# Patient Record
Sex: Female | Born: 1995 | Hispanic: Yes | Smoking: Never smoker
Health system: Southern US, Community
[De-identification: ages and names within clinical notes are randomized; demographics above are authoritative.]

## PROBLEM LIST (undated history)

## (undated) DIAGNOSIS — N390 Urinary tract infection, site not specified: Secondary | ICD-10-CM

## (undated) DIAGNOSIS — I499 Cardiac arrhythmia, unspecified: Secondary | ICD-10-CM

## (undated) DIAGNOSIS — R519 Headache, unspecified: Secondary | ICD-10-CM

## (undated) DIAGNOSIS — F419 Anxiety disorder, unspecified: Secondary | ICD-10-CM

## (undated) HISTORY — PX: NO PAST SURGERIES: SHX2092

---

## 2015-03-30 DIAGNOSIS — D649 Anemia, unspecified: Secondary | ICD-10-CM

## 2015-03-30 HISTORY — DX: Anemia, unspecified: D64.9

## 2015-03-30 NOTE — L&D Delivery Note (Signed)
Delivery Note At 10:31 PM a viable female was delivered via Vaginal, Spontaneous Delivery after Pitocin Augmentation (Presentation:vertex ; ROA ).  APGAR:8 ,9 ; weight pending .   Placenta status:spontaneous ,3VI .  Cord.  Cord pH: NA  Anesthesia:  Epidural Episiotomy: None Lacerations: None Suture Repair: NA Est. Blood Loss (mL): 350  Mom to postpartum.  Baby to Couplet care / Skin to Skin  Bottlefeeding.  Prentice DockerMartin A Defrancesco 11/20/2015, 10:47 PM

## 2015-05-14 ENCOUNTER — Other Ambulatory Visit: Payer: Self-pay | Admitting: Advanced Practice Midwife

## 2015-05-14 DIAGNOSIS — Z3401 Encounter for supervision of normal first pregnancy, first trimester: Secondary | ICD-10-CM

## 2015-05-14 LAB — OB RESULTS CONSOLE HGB/HCT, BLOOD: HEMOGLOBIN: 13.4 g/dL

## 2015-05-15 ENCOUNTER — Ambulatory Visit
Admission: RE | Admit: 2015-05-15 | Discharge: 2015-05-15 | Disposition: A | Discharge status: Home / Self Care | Payer: Medicaid Other | Source: Ambulatory Visit | Attending: Advanced Practice Midwife | Admitting: Advanced Practice Midwife

## 2015-05-15 DIAGNOSIS — Z3401 Encounter for supervision of normal first pregnancy, first trimester: Secondary | ICD-10-CM | POA: Insufficient documentation

## 2015-05-15 DIAGNOSIS — Z3A09 9 weeks gestation of pregnancy: Secondary | ICD-10-CM | POA: Diagnosis not present

## 2015-05-15 LAB — OB RESULTS CONSOLE ABO/RH: RH Type: POSITIVE

## 2015-05-15 LAB — OB RESULTS CONSOLE PLATELET COUNT: PLATELETS: 257 10*3/uL

## 2015-05-15 LAB — OB RESULTS CONSOLE ANTIBODY SCREEN: Antibody Screen: NEGATIVE

## 2015-05-15 LAB — OB RESULTS CONSOLE HGB/HCT, BLOOD: HEMATOCRIT: 42 %

## 2015-05-15 LAB — OB RESULTS CONSOLE HEPATITIS B SURFACE ANTIGEN: Hepatitis B Surface Ag: NEGATIVE

## 2015-05-15 LAB — OB RESULTS CONSOLE HIV ANTIBODY (ROUTINE TESTING): HIV: NONREACTIVE

## 2015-07-16 ENCOUNTER — Other Ambulatory Visit: Payer: Self-pay | Admitting: Family Medicine

## 2015-07-16 DIAGNOSIS — Z3689 Encounter for other specified antenatal screening: Secondary | ICD-10-CM

## 2015-07-16 DIAGNOSIS — Z113 Encounter for screening for infections with a predominantly sexual mode of transmission: Secondary | ICD-10-CM

## 2015-07-25 ENCOUNTER — Ambulatory Visit
Admission: RE | Admit: 2015-07-25 | Discharge: 2015-07-25 | Disposition: A | Discharge status: Home / Self Care | Payer: Medicaid Other | Source: Ambulatory Visit | Attending: Family Medicine | Admitting: Family Medicine

## 2015-07-25 DIAGNOSIS — Z3A19 19 weeks gestation of pregnancy: Secondary | ICD-10-CM | POA: Insufficient documentation

## 2015-07-25 DIAGNOSIS — Z36 Encounter for antenatal screening of mother: Secondary | ICD-10-CM | POA: Insufficient documentation

## 2015-07-25 DIAGNOSIS — O321XX0 Maternal care for breech presentation, not applicable or unspecified: Secondary | ICD-10-CM | POA: Insufficient documentation

## 2015-07-25 DIAGNOSIS — Z3689 Encounter for other specified antenatal screening: Secondary | ICD-10-CM

## 2015-08-29 ENCOUNTER — Encounter: Payer: Self-pay | Admitting: Obstetrics and Gynecology

## 2015-08-29 ENCOUNTER — Ambulatory Visit (INDEPENDENT_AMBULATORY_CARE_PROVIDER_SITE_OTHER): Payer: Medicaid Other | Admitting: Obstetrics and Gynecology

## 2015-08-29 VITALS — BP 104/72 | HR 93 | Ht 65.0 in | Wt 131.6 lb

## 2015-08-29 DIAGNOSIS — Z202 Contact with and (suspected) exposure to infections with a predominantly sexual mode of transmission: Secondary | ICD-10-CM

## 2015-08-29 DIAGNOSIS — Z331 Pregnant state, incidental: Secondary | ICD-10-CM

## 2015-08-29 LAB — POCT URINALYSIS DIPSTICK
Bilirubin, UA: NEGATIVE
Blood, UA: NEGATIVE
Glucose, UA: NEGATIVE
KETONES UA: NEGATIVE
LEUKOCYTES UA: NEGATIVE
Nitrite, UA: NEGATIVE
PH UA: 6.5
PROTEIN UA: NEGATIVE
SPEC GRAV UA: 1.015
UROBILINOGEN UA: 0.2

## 2015-08-29 NOTE — Progress Notes (Signed)
NOB transfer from [facility] she is doing well

## 2015-08-29 NOTE — Progress Notes (Signed)
Transfer OB- happy about pregnancy, Works PT at [employer], and in school PT at [employer]. Encouraged enrolling in CBC and IFC; plans to bottle feed.

## 2015-09-04 ENCOUNTER — Other Ambulatory Visit: Payer: Self-pay

## 2015-09-04 DIAGNOSIS — Z113 Encounter for screening for infections with a predominantly sexual mode of transmission: Secondary | ICD-10-CM

## 2015-09-09 LAB — GC/CHLAMYDIA PROBE AMP
Chlamydia trachomatis, NAA: NEGATIVE
NEISSERIA GONORRHOEAE BY PCR: NEGATIVE

## 2015-09-19 ENCOUNTER — Encounter: Payer: Self-pay | Admitting: Obstetrics and Gynecology

## 2015-09-19 ENCOUNTER — Ambulatory Visit (INDEPENDENT_AMBULATORY_CARE_PROVIDER_SITE_OTHER): Payer: Medicaid Other | Admitting: Obstetrics and Gynecology

## 2015-09-19 VITALS — BP 91/65 | HR 85 | Wt 135.7 lb

## 2015-09-19 DIAGNOSIS — Z23 Encounter for immunization: Secondary | ICD-10-CM

## 2015-09-19 DIAGNOSIS — Z131 Encounter for screening for diabetes mellitus: Secondary | ICD-10-CM

## 2015-09-19 DIAGNOSIS — Z3493 Encounter for supervision of normal pregnancy, unspecified, third trimester: Secondary | ICD-10-CM

## 2015-09-19 LAB — POCT URINALYSIS DIPSTICK
BILIRUBIN UA: NEGATIVE
Blood, UA: NEGATIVE
GLUCOSE UA: NEGATIVE
KETONES UA: 5
Nitrite, UA: NEGATIVE
SPEC GRAV UA: 1.015
Urobilinogen, UA: 0.2
pH, UA: 6.5

## 2015-09-19 MED ORDER — TETANUS-DIPHTH-ACELL PERTUSSIS 5-2.5-18.5 LF-MCG/0.5 IM SUSP
0.5000 mL | Freq: Once | INTRAMUSCULAR | Status: AC
  Administered 2015-09-19: 0.5 mL via INTRAMUSCULAR

## 2015-09-19 NOTE — Progress Notes (Signed)
ROB-doing well, no concerns. 

## 2015-09-19 NOTE — Progress Notes (Signed)
ROB- glucola done, blood consent signed,tdap given Pt is doing well 

## 2015-09-19 NOTE — Patient Instructions (Signed)

## 2015-09-20 LAB — HEMOGLOBIN AND HEMATOCRIT, BLOOD
HEMOGLOBIN: 11.8 g/dL (ref 11.1–15.9)
Hematocrit: 35.2 % (ref 34.0–46.6)

## 2015-09-20 LAB — URINE CULTURE: ORGANISM ID, BACTERIA: NO GROWTH

## 2015-09-20 LAB — PLEASE NOTE

## 2015-09-20 LAB — GLUCOSE, 1 HOUR GESTATIONAL: Gestational Diabetes Screen: 67 mg/dL (ref 65–139)

## 2015-10-02 ENCOUNTER — Encounter: Payer: Self-pay | Admitting: Obstetrics and Gynecology

## 2015-10-02 ENCOUNTER — Ambulatory Visit (INDEPENDENT_AMBULATORY_CARE_PROVIDER_SITE_OTHER): Payer: Medicaid Other | Admitting: Obstetrics and Gynecology

## 2015-10-02 ENCOUNTER — Other Ambulatory Visit: Payer: Self-pay | Admitting: Obstetrics and Gynecology

## 2015-10-02 VITALS — BP 96/60 | HR 83 | Wt 136.3 lb

## 2015-10-02 DIAGNOSIS — Z3493 Encounter for supervision of normal pregnancy, unspecified, third trimester: Secondary | ICD-10-CM

## 2015-10-02 LAB — POCT URINALYSIS DIPSTICK
BILIRUBIN UA: NEGATIVE
Blood, UA: NEGATIVE
Glucose, UA: NEGATIVE
KETONES UA: NEGATIVE
NITRITE UA: NEGATIVE
PH UA: 7.5
Spec Grav, UA: 1.01
Urobilinogen, UA: 0.2

## 2015-10-02 MED ORDER — PRENATE PIXIE 10-0.6-0.4-200 MG PO CAPS: 1.0000 | ORAL_CAPSULE | Freq: Every day | ORAL | Status: DC

## 2015-10-02 NOTE — Progress Notes (Signed)
ROB- doing well, 

## 2015-10-02 NOTE — Progress Notes (Signed)
ROB-pt denies any complaints 

## 2015-10-04 LAB — URINE CULTURE: Organism ID, Bacteria: NO GROWTH

## 2015-10-22 ENCOUNTER — Ambulatory Visit (INDEPENDENT_AMBULATORY_CARE_PROVIDER_SITE_OTHER): Payer: Medicaid Other | Admitting: Obstetrics and Gynecology

## 2015-10-22 VITALS — BP 113/66 | HR 99 | Wt 144.0 lb

## 2015-10-22 DIAGNOSIS — Z3493 Encounter for supervision of normal pregnancy, unspecified, third trimester: Secondary | ICD-10-CM

## 2015-10-22 LAB — POCT URINALYSIS DIPSTICK
Blood, UA: NEGATIVE
Glucose, UA: 100
KETONES UA: NEGATIVE
LEUKOCYTES UA: NEGATIVE
Nitrite, UA: NEGATIVE
PH UA: 6.5
SPEC GRAV UA: 1.02
UROBILINOGEN UA: 0.2

## 2015-10-22 NOTE — Progress Notes (Signed)
ROB-doing well, considering IUD PP- h/o heavy menses- will most likely use Mirena

## 2015-10-22 NOTE — Progress Notes (Signed)
ROB- pt is doing well denies any complaints 

## 2015-10-22 NOTE — Patient Instructions (Signed)
Levonorgestrel intrauterine device (IUD) What is this medicine? LEVONORGESTREL IUD (LEE voe nor jes trel) is a contraceptive (birth control) device. The device is placed inside the uterus by a healthcare professional. It is used to prevent pregnancy and can also be used to treat heavy bleeding that occurs during your period. Depending on the device, it can be used for 3 to 5 years. This medicine may be used for other purposes; ask your health care provider or pharmacist if you have questions. What should I tell my health care provider before I take this medicine? They need to know if you have any of these conditions: -abnormal Pap smear -cancer of the breast, uterus, or cervix -diabetes -endometritis -genital or pelvic infection now or in the past -have more than one sexual partner or your partner has more than one partner -heart disease -history of an ectopic or tubal pregnancy -immune system problems -IUD in place -liver disease or tumor -problems with blood clots or take blood-thinners -use intravenous drugs -uterus of unusual shape -vaginal bleeding that has not been explained -an unusual or allergic reaction to levonorgestrel, other hormones, silicone, or polyethylene, medicines, foods, dyes, or preservatives -pregnant or trying to get pregnant -breast-feeding How should I use this medicine? This device is placed inside the uterus by a health care professional. Talk to your pediatrician regarding the use of this medicine in children. Special care may be needed. Overdosage: If you think you have taken too much of this medicine contact a poison control center or emergency room at once. NOTE: This medicine is only for you. Do not share this medicine with others. What if I miss a dose? This does not apply. What may interact with this medicine? Do not take this medicine with any of the following medications: -amprenavir -bosentan -fosamprenavir This medicine may also interact with  the following medications: -aprepitant -barbiturate medicines for inducing sleep or treating seizures -bexarotene -griseofulvin -medicines to treat seizures like carbamazepine, ethotoin, felbamate, oxcarbazepine, phenytoin, topiramate -modafinil -pioglitazone -rifabutin -rifampin -rifapentine -some medicines to treat HIV infection like atazanavir, indinavir, lopinavir, nelfinavir, tipranavir, ritonavir -St. John's wort -warfarin This list may not describe all possible interactions. Give your health care provider a list of all the medicines, herbs, non-prescription drugs, or dietary supplements you use. Also tell them if you smoke, drink alcohol, or use illegal drugs. Some items may interact with your medicine. What should I watch for while using this medicine? Visit your doctor or health care professional for regular check ups. See your doctor if you or your partner has sexual contact with others, becomes HIV positive, or gets a sexual transmitted disease. This product does not protect you against HIV infection (AIDS) or other sexually transmitted diseases. You can check the placement of the IUD yourself by reaching up to the top of your vagina with clean fingers to feel the threads. Do not pull on the threads. It is a good habit to check placement after each menstrual period. Call your doctor right away if you feel more of the IUD than just the threads or if you cannot feel the threads at all. The IUD may come out by itself. You may become pregnant if the device comes out. If you notice that the IUD has come out use a backup birth control method like condoms and call your health care provider. Using tampons will not change the position of the IUD and are okay to use during your period. What side effects may I notice from receiving this medicine?   Side effects that you should report to your doctor or health care professional as soon as possible: -allergic reactions like skin rash, itching or  hives, swelling of the face, lips, or tongue -fever, flu-like symptoms -genital sores -high blood pressure -no menstrual period for 6 weeks during use -pain, swelling, warmth in the leg -pelvic pain or tenderness -severe or sudden headache -signs of pregnancy -stomach cramping -sudden shortness of breath -trouble with balance, talking, or walking -unusual vaginal bleeding, discharge -yellowing of the eyes or skin Side effects that usually do not require medical attention (report to your doctor or health care professional if they continue or are bothersome): -acne -breast pain -change in sex drive or performance -changes in weight -cramping, dizziness, or faintness while the device is being inserted -headache -irregular menstrual bleeding within first 3 to 6 months of use -nausea This list may not describe all possible side effects. Call your doctor for medical advice about side effects. You may report side effects to FDA at 1-800-FDA-1088. Where should I keep my medicine? This does not apply. NOTE: This sheet is a summary. It may not cover all possible information. If you have questions about this medicine, talk to your doctor, pharmacist, or health care provider.    2016, Elsevier/Gold Standard. (2011-04-15 13:54:04)  

## 2015-11-12 ENCOUNTER — Encounter: Payer: Medicaid Other | Admitting: Obstetrics and Gynecology

## 2015-11-12 ENCOUNTER — Ambulatory Visit (INDEPENDENT_AMBULATORY_CARE_PROVIDER_SITE_OTHER): Payer: Medicaid Other | Admitting: Obstetrics and Gynecology

## 2015-11-12 VITALS — BP 113/83 | HR 120 | Wt 146.5 lb

## 2015-11-12 DIAGNOSIS — Z113 Encounter for screening for infections with a predominantly sexual mode of transmission: Secondary | ICD-10-CM

## 2015-11-12 DIAGNOSIS — Z36 Encounter for antenatal screening of mother: Secondary | ICD-10-CM

## 2015-11-12 DIAGNOSIS — Z3493 Encounter for supervision of normal pregnancy, unspecified, third trimester: Secondary | ICD-10-CM

## 2015-11-12 DIAGNOSIS — Z3685 Encounter for antenatal screening for Streptococcus B: Secondary | ICD-10-CM

## 2015-11-12 NOTE — Progress Notes (Signed)
ROB- cultures obtained and labor precautions discussed.

## 2015-11-12 NOTE — Progress Notes (Signed)
ROB- cultures obtained, pt is very emotional states she has good support, pt is having some pelvic pressure

## 2015-11-14 LAB — GC/CHLAMYDIA PROBE AMP
Chlamydia trachomatis, NAA: NEGATIVE
Neisseria gonorrhoeae by PCR: NEGATIVE

## 2015-11-14 LAB — STREP GP B NAA: STREP GROUP B AG: NEGATIVE

## 2015-11-18 ENCOUNTER — Ambulatory Visit (INDEPENDENT_AMBULATORY_CARE_PROVIDER_SITE_OTHER): Payer: Medicaid Other | Admitting: Obstetrics and Gynecology

## 2015-11-18 VITALS — BP 115/70 | HR 104 | Wt 149.2 lb

## 2015-11-18 DIAGNOSIS — Z3493 Encounter for supervision of normal pregnancy, unspecified, third trimester: Secondary | ICD-10-CM

## 2015-11-18 LAB — POCT URINALYSIS DIPSTICK
Blood, UA: NEGATIVE
GLUCOSE UA: NEGATIVE
KETONES UA: 5
NITRITE UA: NEGATIVE
SPEC GRAV UA: 1.025
UROBILINOGEN UA: 0.2
pH, UA: 6.5

## 2015-11-18 NOTE — Progress Notes (Signed)
ROB-reports increased frequency of contractions, discussed negative cultures. Labor precautions re-iterated.

## 2015-11-18 NOTE — Progress Notes (Signed)
ROB- pt is having lots of pelvic pressure, contractions

## 2015-11-20 ENCOUNTER — Inpatient Hospital Stay
Admission: EM | Admit: 2015-11-20 | Discharge: 2015-11-22 | DRG: 775 | Disposition: A | Discharge status: Home / Self Care | Payer: Medicaid Other | Attending: Obstetrics and Gynecology | Admitting: Obstetrics and Gynecology

## 2015-11-20 ENCOUNTER — Inpatient Hospital Stay: Payer: Medicaid Other | Admitting: Anesthesiology

## 2015-11-20 DIAGNOSIS — O4290 Premature rupture of membranes, unspecified as to length of time between rupture and onset of labor, unspecified weeks of gestation: Secondary | ICD-10-CM | POA: Diagnosis present

## 2015-11-20 DIAGNOSIS — Z3403 Encounter for supervision of normal first pregnancy, third trimester: Secondary | ICD-10-CM | POA: Diagnosis not present

## 2015-11-20 DIAGNOSIS — Z3A36 36 weeks gestation of pregnancy: Secondary | ICD-10-CM

## 2015-11-20 DIAGNOSIS — Z79899 Other long term (current) drug therapy: Secondary | ICD-10-CM

## 2015-11-20 LAB — CHLAMYDIA/NGC RT PCR (ARMC ONLY)
Chlamydia Tr: NOT DETECTED
N GONORRHOEAE: NOT DETECTED

## 2015-11-20 LAB — TYPE AND SCREEN
ABO/RH(D): O POS
ANTIBODY SCREEN: NEGATIVE

## 2015-11-20 LAB — CBC
HCT: 37.1 % (ref 35.0–47.0)
HEMOGLOBIN: 12.8 g/dL (ref 12.0–16.0)
MCH: 28.5 pg (ref 26.0–34.0)
MCHC: 34.3 g/dL (ref 32.0–36.0)
MCV: 82.8 fL (ref 80.0–100.0)
PLATELETS: 164 10*3/uL (ref 150–440)
RBC: 4.48 MIL/uL (ref 3.80–5.20)
RDW: 14.4 % (ref 11.5–14.5)
WBC: 10.4 10*3/uL (ref 3.6–11.0)

## 2015-11-20 MED ORDER — SODIUM CHLORIDE FLUSH 0.9 % IV SOLN
INTRAVENOUS | Status: AC
  Filled 2015-11-20: qty 10

## 2015-11-20 MED ORDER — LIDOCAINE HCL (PF) 1 % IJ SOLN
30.0000 mL | INTRAMUSCULAR | Status: DC | PRN
  Filled 2015-11-20: qty 30

## 2015-11-20 MED ORDER — FENTANYL CITRATE (PF) 100 MCG/2ML IJ SOLN: 50.0000 ug | INTRAMUSCULAR | Status: DC | PRN

## 2015-11-20 MED ORDER — KETOROLAC TROMETHAMINE 30 MG/ML IJ SOLN: 30.0000 mg | Freq: Four times a day (QID) | INTRAMUSCULAR | Status: AC | PRN

## 2015-11-20 MED ORDER — OXYTOCIN 40 UNITS IN LACTATED RINGERS INFUSION - SIMPLE MED
2.5000 [IU]/h | INTRAVENOUS | Status: DC
  Administered 2015-11-20: 39.96 [IU]/h via INTRAVENOUS
  Filled 2015-11-20: qty 1000

## 2015-11-20 MED ORDER — FENTANYL 2.5 MCG/ML W/ROPIVACAINE 0.2% IN NS 100 ML EPIDURAL INFUSION (ARMC-ANES)
EPIDURAL | Status: DC | PRN
  Administered 2015-11-20: 10 mL/h via EPIDURAL

## 2015-11-20 MED ORDER — NALBUPHINE HCL 10 MG/ML IJ SOLN: 5.0000 mg | Freq: Once | INTRAMUSCULAR | Status: DC | PRN

## 2015-11-20 MED ORDER — MEPERIDINE HCL 25 MG/ML IJ SOLN: 6.2500 mg | INTRAMUSCULAR | Status: DC | PRN

## 2015-11-20 MED ORDER — BUPIVACAINE HCL (PF) 0.25 % IJ SOLN
INTRAMUSCULAR | Status: DC | PRN
  Administered 2015-11-20: 5 mL via EPIDURAL

## 2015-11-20 MED ORDER — NALOXONE HCL 2 MG/2ML IJ SOSY
1.0000 ug/kg/h | PREFILLED_SYRINGE | INTRAMUSCULAR | Status: DC | PRN
  Filled 2015-11-20: qty 2

## 2015-11-20 MED ORDER — OXYCODONE-ACETAMINOPHEN 5-325 MG PO TABS: 2.0000 | ORAL_TABLET | ORAL | Status: DC | PRN

## 2015-11-20 MED ORDER — DIPHENHYDRAMINE HCL 50 MG/ML IJ SOLN: 12.5000 mg | INTRAMUSCULAR | Status: DC | PRN

## 2015-11-20 MED ORDER — SODIUM CHLORIDE 0.9% FLUSH: 3.0000 mL | INTRAVENOUS | Status: DC | PRN

## 2015-11-20 MED ORDER — MISOPROSTOL 200 MCG PO TABS
ORAL_TABLET | ORAL | Status: AC
  Filled 2015-11-20: qty 4

## 2015-11-20 MED ORDER — NALBUPHINE HCL 10 MG/ML IJ SOLN: 5.0000 mg | INTRAMUSCULAR | Status: DC | PRN

## 2015-11-20 MED ORDER — LACTATED RINGERS IV SOLN
INTRAVENOUS | Status: DC
  Administered 2015-11-20 – 2015-11-21 (×4): via INTRAVENOUS

## 2015-11-20 MED ORDER — LACTATED RINGERS IV SOLN: 500.0000 mL | INTRAVENOUS | Status: DC | PRN

## 2015-11-20 MED ORDER — FENTANYL 2.5 MCG/ML W/ROPIVACAINE 0.2% IN NS 100 ML EPIDURAL INFUSION (ARMC-ANES)
EPIDURAL | Status: AC
  Filled 2015-11-20: qty 100

## 2015-11-20 MED ORDER — ONDANSETRON HCL 4 MG/2ML IJ SOLN: 4.0000 mg | Freq: Four times a day (QID) | INTRAMUSCULAR | Status: DC | PRN

## 2015-11-20 MED ORDER — ONDANSETRON HCL 4 MG/2ML IJ SOLN: 4.0000 mg | Freq: Three times a day (TID) | INTRAMUSCULAR | Status: DC | PRN

## 2015-11-20 MED ORDER — OXYTOCIN 10 UNIT/ML IJ SOLN
INTRAMUSCULAR | Status: AC
  Filled 2015-11-20: qty 2

## 2015-11-20 MED ORDER — SCOPOLAMINE 1 MG/3DAYS TD PT72
1.0000 | MEDICATED_PATCH | Freq: Once | TRANSDERMAL | Status: DC
  Administered 2015-11-21: 1.5 mg via TRANSDERMAL
  Filled 2015-11-20: qty 1

## 2015-11-20 MED ORDER — SOD CITRATE-CITRIC ACID 500-334 MG/5ML PO SOLN: 30.0000 mL | ORAL | Status: DC | PRN

## 2015-11-20 MED ORDER — OXYTOCIN 40 UNITS IN LACTATED RINGERS INFUSION - SIMPLE MED
1.0000 m[IU]/min | INTRAVENOUS | Status: DC
  Administered 2015-11-20: 1 m[IU]/min via INTRAVENOUS

## 2015-11-20 MED ORDER — ACETAMINOPHEN 325 MG PO TABS: 650.0000 mg | ORAL_TABLET | ORAL | Status: DC | PRN

## 2015-11-20 MED ORDER — OXYTOCIN BOLUS FROM INFUSION: 500.0000 mL | Freq: Once | INTRAVENOUS | Status: DC

## 2015-11-20 MED ORDER — OXYCODONE-ACETAMINOPHEN 5-325 MG PO TABS
1.0000 | ORAL_TABLET | ORAL | Status: DC | PRN
  Administered 2015-11-21 (×2): 1 via ORAL
  Filled 2015-11-20 (×2): qty 1

## 2015-11-20 MED ORDER — DIPHENHYDRAMINE HCL 25 MG PO CAPS: 25.0000 mg | ORAL_CAPSULE | ORAL | Status: DC | PRN

## 2015-11-20 MED ORDER — TERBUTALINE SULFATE 1 MG/ML IJ SOLN: 0.2500 mg | Freq: Once | INTRAMUSCULAR | Status: DC | PRN

## 2015-11-20 MED ORDER — NALOXONE HCL 0.4 MG/ML IJ SOLN: 0.4000 mg | INTRAMUSCULAR | Status: DC | PRN

## 2015-11-20 NOTE — Anesthesia Procedure Notes (Signed)
Epidural Patient location during procedure: OB  Staffing Anesthesiologist: Berdine AddisonHOMAS, Nancy Orozco Performed: anesthesiologist   Preanesthetic Checklist Completed: patient identified, site marked, surgical consent, pre-op evaluation, timeout performed, IV checked, risks and benefits discussed and monitors and equipment checked  Epidural Patient position: sitting Prep: Betadine Patient monitoring: heart rate, continuous pulse ox and blood pressure Approach: midline Location: L4-L5 Injection technique: LOR saline  Needle:  Needle type: Tuohy  Needle gauge: 18 G Needle length: 9 cm and 9 Catheter type: closed end flexible Catheter size: 20 Guage Test dose: negative and 1.5% lidocaine with Epi 1:200 K  Assessment Sensory level: T10 Events: blood not aspirated, injection not painful, no injection resistance, negative IV test and no paresthesia  Additional Notes   Patient tolerated the insertion well without complications. 1924 catheter in. 1925 test. 1929 bolus 5ml 0.25% marcaine. Infusion 1932.Reason for block:procedure for pain

## 2015-11-20 NOTE — Progress Notes (Signed)
Labor Check: Pt comfortable. FHR-Cat 1 Contractions - every 4 minutes AF-Clear (SROM) @ 1000 today Cx: 4-5/80/0/vertex  Plan: 1. Pitocin Augmentation 2. Epidural  Herold HarmsMartin A Kyron Schlitt, MD

## 2015-11-20 NOTE — H&P (Signed)
Obstetric History and Physical  Nancy Orozco is a 20 y.o. G1P0 with IUP at 10559w6d presenting for ROM and Labor. Patient states she has been having  Irregular;, none vaginal bleeding, intact, ruptured, clear fluid membranes, with active fetal movement.    Prenatal Course Source of Care: [facility] Pregnancy complications or risks: Patient Active Problem List   Diagnosis Date Noted  . Chlamydia contact, treated 08/29/2015   She plans to breastfeed She desires condoms for postpartum contraception.   Prenatal labs and studies: ABO, Rh: O/Positive/-- (02/16 0000) Antibody: Negative (02/16 0000) Rubella:   RPR:    HBsAg: Negative (02/16 0000)  HIV: Non-reactive (02/16 0000)  RUE:AVWUJWJXGBS:Negative (08/16 1626) 1 hr Glucola 67 Genetic screening not done Anatomy US normal  Prenatal Transfer Tool   No past surgical history on file.  OB History  Gravida Para Term Preterm AB Living  1            SAB TAB Ectopic Multiple Live Births               # Outcome Date GA Lbr Len/2nd Weight Sex Delivery Anes PTL Lv  1 Current               Social History   Social History  . Marital status: Single    Spouse name: N/A  . Number of children: N/A  . Years of education: N/A   Social History Main Topics  . Smoking status: Never Smoker  . Smokeless tobacco: Never Used  . Alcohol use No  . Drug use: No  . Sexual activity: Yes   Other Topics Concern  . Not on file   Social History Narrative  . No narrative on file    No family history on file.  Prescriptions Prior to Admission  Medication Sig Dispense Refill Last Dose  . Prenat-FeAsp-Meth-FA-DHA w/o A (PRENATE PIXIE) 10-0.6-0.4-200 MG CAPS Take 1 capsule by mouth daily. 30 capsule 6 Taking  . Prenatal Vit-Fe Fumarate-FA (PRENATAL MULTIVITAMIN) TABS tablet Take 1 tablet by mouth daily at 12 noon.   Not Taking    No Known Allergies  Review of Systems: Negative except for what is mentioned in HPI.  Physical Exam: BP 120/77 (BP  Location: Left Arm)   Pulse 92   Temp 98.5 F (36.9 C) (Oral)   Resp 16   LMP 03/07/2015  CONSTITUTIONAL: Well-developed, well-nourished female in no acute distress.  HENT:  Normocephalic, atraumatic, External right and left ear normal. Oropharynx is clear and moist EYES: Conjunctivae and EOM are normal. Pupils are equal, round, and reactive to light. No scleral icterus.  NECK: Normal range of motion, supple, no masses SKIN: Skin is warm and dry. No rash noted. Not diaphoretic. No erythema. No pallor. NEUROLGIC: Alert and oriented to person, place, and time. Normal reflexes, muscle tone coordination. No cranial nerve deficit noted. PSYCHIATRIC: Normal mood and affect. Normal behavior. Normal judgment and thought content. CARDIOVASCULAR: Normal heart rate noted, regular rhythm RESPIRATORY: Effort and breath sounds normal, no problems with respiration noted ABDOMEN: Soft, nontender, nondistended, gravid. MUSCULOSKELETAL: Normal range of motion. No edema and no tenderness. 2+ distal pulses.  Cervical Exam: Dilatation 5cm   Effacement 70%   Station -3   Presentation: cephalic FHT:  Baseline rate 145 bpm   Variability moderate  Accelerations present   Decelerations none Contractions: Every 4 mins   Pertinent Labs/Studies:   No results found for this or any previous visit (from the past 24 hour(s)).  Assessment : Nancy Sjogrenindy Ostrum  is a 20 y.o. G1P0 at [redacted]w[redacted]d being admitted for labor. SROM-clear; GBS NEGATIVE O+ Blood  Plan: Labor: Expectant management.  Induction/Augmentation as needed, per protocol FWB: Reassuring fetal heart tracing.  GBS negative Delivery plan: Hopeful for vaginal delivery  Martin A. Beatris Si, MD, FACOG [facility]

## 2015-11-20 NOTE — Progress Notes (Signed)
Labor Note:  FHR: Cat 1 CX: C/C/+1/ROA  Plan: Start pushing  Herold HarmsMartin A Praise Dolecki, MD

## 2015-11-20 NOTE — Anesthesia Preprocedure Evaluation (Signed)
Anesthesia Evaluation  Patient identified by MRN, date of birth, ID band Patient awake    Reviewed: Allergy & Precautions, NPO status , Patient's Chart, lab work & pertinent test results, reviewed documented beta blocker date and time   Airway Mallampati: II  TM Distance: >3 FB     Dental  (+) Chipped   Pulmonary           Cardiovascular      Neuro/Psych    GI/Hepatic   Endo/Other    Renal/GU      Musculoskeletal   Abdominal   Peds  Hematology   Anesthesia Other Findings   Reproductive/Obstetrics                             Anesthesia Physical Anesthesia Plan  ASA: II  Anesthesia Plan: Epidural   Post-op Pain Management:    Induction:   Airway Management Planned:   Additional Equipment:   Intra-op Plan:   Post-operative Plan:   Informed Consent: I have reviewed the patients History and Physical, chart, labs and discussed the procedure including the risks, benefits and alternatives for the proposed anesthesia with the patient or authorized representative who has indicated his/her understanding and acceptance.     Plan Discussed with: CRNA  Anesthesia Plan Comments:         Anesthesia Quick Evaluation  

## 2015-11-20 NOTE — Progress Notes (Signed)
Labor Check:  Cx: unchanged FHR: Category 1  Assessment: 1. Slow progress  Plan:  1.  Continue Pitocin Augmentation.  Herold HarmsMartin A Avriel Kandel, MD

## 2015-11-21 DIAGNOSIS — Z3403 Encounter for supervision of normal first pregnancy, third trimester: Secondary | ICD-10-CM

## 2015-11-21 LAB — CBC
HCT: 34.9 % — ABNORMAL LOW (ref 35.0–47.0)
Hemoglobin: 11.9 g/dL — ABNORMAL LOW (ref 12.0–16.0)
MCH: 28.3 pg (ref 26.0–34.0)
MCHC: 34.2 g/dL (ref 32.0–36.0)
MCV: 82.6 fL (ref 80.0–100.0)
PLATELETS: 141 10*3/uL — AB (ref 150–440)
RBC: 4.22 MIL/uL (ref 3.80–5.20)
RDW: 14.5 % (ref 11.5–14.5)
WBC: 13 10*3/uL — AB (ref 3.6–11.0)

## 2015-11-21 LAB — RPR: RPR Ser Ql: NONREACTIVE

## 2015-11-21 MED ORDER — WITCH HAZEL-GLYCERIN EX PADS: 1.0000 "application " | MEDICATED_PAD | CUTANEOUS | Status: DC | PRN

## 2015-11-21 MED ORDER — IBUPROFEN 800 MG PO TABS
800.0000 mg | ORAL_TABLET | Freq: Three times a day (TID) | ORAL | Status: DC
  Administered 2015-11-21 – 2015-11-22 (×6): 800 mg via ORAL
  Filled 2015-11-21 (×6): qty 1

## 2015-11-21 MED ORDER — BENZOCAINE-MENTHOL 20-0.5 % EX AERO: 1.0000 "application " | INHALATION_SPRAY | CUTANEOUS | Status: DC | PRN

## 2015-11-21 MED ORDER — PRENATAL MULTIVITAMIN CH
1.0000 | ORAL_TABLET | Freq: Every day | ORAL | Status: DC
  Administered 2015-11-21 – 2015-11-22 (×2): 1 via ORAL
  Filled 2015-11-21 (×2): qty 1

## 2015-11-21 MED ORDER — ONDANSETRON HCL 4 MG PO TABS: 4.0000 mg | ORAL_TABLET | ORAL | Status: DC | PRN

## 2015-11-21 MED ORDER — MEASLES, MUMPS & RUBELLA VAC ~~LOC~~ INJ: 0.5000 mL | INJECTION | Freq: Once | SUBCUTANEOUS | Status: DC

## 2015-11-21 MED ORDER — ONDANSETRON HCL 4 MG/2ML IJ SOLN: 4.0000 mg | INTRAMUSCULAR | Status: DC | PRN

## 2015-11-21 MED ORDER — IBUPROFEN 800 MG PO TABS: 800.0000 mg | ORAL_TABLET | Freq: Three times a day (TID) | ORAL | 0 refills | Status: DC

## 2015-11-21 MED ORDER — ACETAMINOPHEN 325 MG PO TABS: 650.0000 mg | ORAL_TABLET | ORAL | Status: DC | PRN

## 2015-11-21 MED ORDER — DOCUSATE SODIUM 100 MG PO CAPS
100.0000 mg | ORAL_CAPSULE | Freq: Two times a day (BID) | ORAL | Status: DC
  Administered 2015-11-21 – 2015-11-22 (×2): 100 mg via ORAL
  Filled 2015-11-21 (×2): qty 1

## 2015-11-21 MED ORDER — OXYTOCIN 40 UNITS IN LACTATED RINGERS INFUSION - SIMPLE MED: 2.5000 [IU]/h | INTRAVENOUS | Status: DC | PRN

## 2015-11-21 MED ORDER — COCONUT OIL OIL
1.0000 | TOPICAL_OIL | Status: DC | PRN
Start: 2015-11-21 — End: 2015-11-22

## 2015-11-21 MED ORDER — DIBUCAINE 1 % RE OINT: 1.0000 "application " | TOPICAL_OINTMENT | RECTAL | Status: DC | PRN

## 2015-11-21 MED ORDER — SIMETHICONE 80 MG PO CHEW: 80.0000 mg | CHEWABLE_TABLET | ORAL | Status: DC | PRN

## 2015-11-21 MED ORDER — TETANUS-DIPHTH-ACELL PERTUSSIS 5-2.5-18.5 LF-MCG/0.5 IM SUSP: 0.5000 mL | INTRAMUSCULAR | Status: DC | PRN

## 2015-11-21 MED ORDER — FERROUS SULFATE 325 (65 FE) MG PO TABS
325.0000 mg | ORAL_TABLET | Freq: Two times a day (BID) | ORAL | Status: DC
  Administered 2015-11-21 – 2015-11-22 (×4): 325 mg via ORAL
  Filled 2015-11-21 (×4): qty 1

## 2015-11-21 NOTE — Discharge Summary (Signed)
Obstetric Discharge Summary Reason for Admission: onset of labor and rupture of membranes Prenatal Procedures: ultrasound Intrapartum Procedures: spontaneous vaginal delivery Postpartum Procedures: none Complications-Operative and Postpartum: none Hemoglobin  Date Value Ref Range Status  11/21/2015 11.9 (L) 12.0 - 16.0 g/dL Final  56/21/308602/15/2017 57.813.4 g/dL Final   HCT  Date Value Ref Range Status  11/21/2015 34.9 (L) 35.0 - 47.0 % Final  05/15/2015 42 % Final   Hematocrit  Date Value Ref Range Status  09/19/2015 35.2 34.0 - 46.6 % Final    Physical Exam:  General: alert and cooperative Lochia: appropriate Uterine Fundus: firm Incision: none DVT Evaluation: No evidence of DVT seen on physical exam.  Discharge Diagnoses: Term Pregnancy-delivered (36.6 weeks)  Discharge Information: Date: 11/21/2015 Activity: pelvic rest Diet: routine Medications: PNV and Ibuprofen Condition: stable Instructions: refer to practice specific booklet Discharge to: home Follow-up Information    Melody Suzan NailerN Shambley, CNM .   Specialties:  Obstetrics and Gynecology, Radiology Contact information: [address] [place] [address] [phone]        Herold HarmsMartin A Mashelle Busick, MD. Schedule an appointment as soon as possible for a visit in 6 week(s).   Specialties:  Obstetrics and Gynecology, Radiology Why:  Post Partum check with either Melody Aura CampsShambley or Dr Earley AbideeFrancesco Contact information: [address] [place] [address] [phone]           Newborn Data: Live born female  Birth Weight: 5 lb 9.6 oz (2540 g) APGAR: 9, 9  Home with mother.  Daphine DeutscherMartin A Ladelle Teodoro 11/21/2015, 2:21 PM

## 2015-11-21 NOTE — Progress Notes (Signed)
Post Partum Day 1 Subjective: no complaints, up ad lib, voiding, tolerating PO and + flatus  Bottlefeeding  Objective: Blood pressure 93/63, pulse 69, temperature 98 F (36.7 C), temperature source Oral, resp. rate 18, height 5\' 5"  (1.651 m), weight 149 lb (67.6 kg), last menstrual period 03/07/2015, SpO2 100 %, unknown if currently breastfeeding.  Physical Exam:  General: alert and cooperative Lochia: appropriate Uterine Fundus: firm Incision: none DVT Evaluation: No evidence of DVT seen on physical exam.   Recent Labs  11/20/15 1358 11/21/15 0520  HGB 12.8 11.9*  HCT 37.1 34.9*    Assessment/Plan: Plan for discharge tomorrow and Contraception Condoms; IUD at 6 week check   LOS: 1 day   Prentice DockerMartin A Defrancesco 11/21/2015, 2:15 PM

## 2015-11-21 NOTE — Anesthesia Postprocedure Evaluation (Signed)
Anesthesia Post Note  Patient: Nancy SjogrenCindy Zurcher  Procedure(s) Performed: * No procedures listed *  Patient location during evaluation: Mother Baby Anesthesia Type: Epidural Level of consciousness: awake, awake and alert and oriented Pain management: pain level controlled Vital Signs Assessment: vitals unstable and post-procedure vital signs reviewed and stable Respiratory status: spontaneous breathing, nonlabored ventilation and respiratory function stable Cardiovascular status: stable and blood pressure returned to baseline Postop Assessment: no headache Anesthetic complications: no    Last Vitals:  Vitals:   11/21/15 0131 11/21/15 0426  BP: 111/69 (!) 97/51  Pulse: 92 85  Resp: 20 18  Temp: 36.8 C 36.6 C    Last Pain:  Vitals:   11/21/15 0426  TempSrc: Oral  PainSc:                  Lyn RecordsNoles,  Darragh Nay R

## 2015-11-22 LAB — RUBELLA SCREEN: RUBELLA: 1.22 {index} (ref >0.99)

## 2015-11-22 LAB — VARICELLA ZOSTER ANTIBODY, IGG: Varicella IgG: 568 index (ref >165)

## 2015-11-22 NOTE — Progress Notes (Signed)
Patient given all d/c instructions and understands all including f/u with MD. Pt to make f/u appt with MD. D/cd home with baby.

## 2015-11-22 NOTE — Discharge Summary (Signed)
D/C patient home via wheelchair escorted by nursing staff.

## 2015-11-22 NOTE — Discharge Instructions (Signed)
Ibuprofen tablets and capsules What is this medicine? IBUPROFEN (eye BYOO proe fen) is a non-steroidal anti-inflammatory drug (NSAID). It is used for dental pain, fever, headaches or migraines, osteoarthritis, rheumatoid arthritis, or painful monthly periods. It can also relieve minor aches and pains caused by a cold, flu, or sore throat. This medicine may be used for other purposes; ask your health care provider or pharmacist if you have questions. What should I tell my health care provider before I take this medicine? They need to know if you have any of these conditions: -asthma -cigarette smoker -drink more than 3 alcohol containing drinks a day -heart disease or circulation problems such as heart failure or leg edema (fluid retention) -high blood pressure -kidney disease -liver disease -stomach bleeding or ulcers -an unusual or allergic reaction to ibuprofen, aspirin, other NSAIDS, other medicines, foods, dyes, or preservatives -pregnant or trying to get pregnant -breast-feeding How should I use this medicine? Take this medicine by mouth with a glass of water. Follow the directions on the prescription label. Take this medicine with food if your stomach gets upset. Try to not lie down for at least 10 minutes after you take the medicine. Take your medicine at regular intervals. Do not take your medicine more often than directed. A special MedGuide will be given to you by the pharmacist with each prescription and refill. Be sure to read this information carefully each time. Talk to your pediatrician regarding the use of this medicine in children. Special care may be needed. Overdosage: If you think you have taken too much of this medicine contact a poison control center or emergency room at once. NOTE: This medicine is only for you. Do not share this medicine with others. What if I miss a dose? If you miss a dose, take it as soon as you can. If it is almost time for your next dose, take  only that dose. Do not take double or extra doses. What may interact with this medicine? Do not take this medicine with any of the following medications: -cidofovir -ketorolac -methotrexate -pemetrexed This medicine may also interact with the following medications: -alcohol -aspirin -diuretics -lithium -other drugs for inflammation like prednisone -warfarin This list may not describe all possible interactions. Give your health care provider a list of all the medicines, herbs, non-prescription drugs, or dietary supplements you use. Also tell them if you smoke, drink alcohol, or use illegal drugs. Some items may interact with your medicine. What should I watch for while using this medicine? Tell your doctor or healthcare professional if your symptoms do not start to get better or if they get worse. This medicine does not prevent heart attack or stroke. In fact, this medicine may increase the chance of a heart attack or stroke. The chance may increase with longer use of this medicine and in people who have heart disease. If you take aspirin to prevent heart attack or stroke, talk with your doctor or health care professional. Do not take other medicines that contain aspirin, ibuprofen, or naproxen with this medicine. Side effects such as stomach upset, nausea, or ulcers may be more likely to occur. Many medicines available without a prescription should not be taken with this medicine. This medicine can cause ulcers and bleeding in the stomach and intestines at any time during treatment. Ulcers and bleeding can happen without warning symptoms and can cause death. To reduce your risk, do not smoke cigarettes or drink alcohol while you are taking this medicine. You may get  drowsy or dizzy. Do not drive, use machinery, or do anything that needs mental alertness until you know how this medicine affects you. Do not stand or sit up quickly, especially if you are an older patient. This reduces the risk of  dizzy or fainting spells. This medicine can cause you to bleed more easily. Try to avoid damage to your teeth and gums when you brush or floss your teeth. This medicine may be used to treat migraines. If you take migraine medicines for 10 or more days a month, your migraines may get worse. Keep a diary of headache days and medicine use. Contact your healthcare professional if your migraine attacks occur more frequently. What side effects may I notice from receiving this medicine? Side effects that you should report to your doctor or health care professional as soon as possible: -allergic reactions like skin rash, itching or hives, swelling of the face, lips, or tongue -severe stomach pain -signs and symptoms of bleeding such as bloody or black, tarry stools; red or dark-brown urine; spitting up blood or brown material that looks like coffee grounds; red spots on the skin; unusual bruising or bleeding from the eye, gums, or nose -signs and symptoms of a blood clot such as changes in vision; chest pain; severe, sudden headache; trouble speaking; sudden numbness or weakness of the face, arm, or leg -unexplained weight gain or swelling -unusually weak or tired -yellowing of eyes or skin Side effects that usually do not require medical attention (report to your doctor or health care professional if they continue or are bothersome): -bruising -diarrhea -dizziness, drowsiness -headache -nausea, vomiting This list may not describe all possible side effects. Call your doctor for medical advice about side effects. You may report side effects to FDA at 1-800-FDA-1088. Where should I keep my medicine? Keep out of the reach of children. Store at room temperature between 15 and 30 degrees C (59 and 86 degrees F). Keep container tightly closed. Throw away any unused medicine after the expiration date. NOTE: This sheet is a summary. It may not cover all possible information. If you have questions about this  medicine, talk to your doctor, pharmacist, or health care provider.    2016, Elsevier/Gold Standard. (2012-11-14 10:48:02) Vaginal Delivery, Care After Refer to this sheet in the next few weeks. These discharge instructions provide you with information on caring for yourself after delivery. Your health care provider may also give you specific instructions. Your treatment has been planned according to the most current medical practices available, but problems sometimes occur. Call your health care provider if you have any problems or questions after you go home. HOME CARE INSTRUCTIONS  Take over-the-counter or prescription medicines only as directed by your health care provider or pharmacist.  Do not drink alcohol, especially if you are breastfeeding or taking medicine to relieve pain.  Do not chew or smoke tobacco.  Do not use illegal drugs.  Continue to use good perineal care. Good perineal care includes:  Wiping your perineum from front to back.  Keeping your perineum clean.  Do not use tampons or douche until your health care provider says it is okay.  Shower, wash your hair, and take tub baths as directed by your health care provider.  Wear a well-fitting bra that provides breast support.  Eat healthy foods.  Drink enough fluids to keep your urine clear or pale yellow.  Eat high-fiber foods such as whole grain cereals and breads, brown rice, beans, and fresh fruits and vegetables  every day. These foods may help prevent or relieve constipation.  Follow your health care provider's recommendations regarding resumption of activities such as climbing stairs, driving, lifting, exercising, or traveling.  Talk to your health care provider about resuming sexual activities. Resumption of sexual activities is dependent upon your risk of infection, your rate of healing, and your comfort and desire to resume sexual activity.  Try to have someone help you with your household activities  and your newborn for at least a few days after you leave the hospital.  Rest as much as possible. Try to rest or take a nap when your newborn is sleeping.  Increase your activities gradually.  Keep all of your scheduled postpartum appointments. It is very important to keep your scheduled follow-up appointments. At these appointments, your health care provider will be checking to make sure that you are healing physically and emotionally. SEEK MEDICAL CARE IF:   You are passing large clots from your vagina. Save any clots to show your health care provider.  You have a foul smelling discharge from your vagina.  You have trouble urinating.  You are urinating frequently.  You have pain when you urinate.  You have a change in your bowel movements.  You have increasing redness, pain, or swelling near your vaginal incision (episiotomy) or vaginal tear.  You have pus draining from your episiotomy or vaginal tear.  Your episiotomy or vaginal tear is separating.  You have painful, hard, or reddened breasts.  You have a severe headache.  You have blurred vision or see spots.  You feel sad or depressed.  You have thoughts of hurting yourself or your newborn.  You have questions about your care, the care of your newborn, or medicines.  You are dizzy or light-headed.  You have a rash.  You have nausea or vomiting.  You were breastfeeding and have not had a menstrual period within 12 weeks after you stopped breastfeeding.  You are not breastfeeding and have not had a menstrual period by the 12th week after delivery.  You have a fever. SEEK IMMEDIATE MEDICAL CARE IF:   You have persistent pain.  You have chest pain.  You have shortness of breath.  You faint.  You have leg pain.  You have stomach pain.  Your vaginal bleeding saturates two or more sanitary pads in 1 hour.   This information is not intended to replace advice given to you by your health care provider.  Make sure you discuss any questions you have with your health care provider.   Document Released: 03/12/2000 Document Revised: 12/04/2014 Document Reviewed: 11/10/2011 Elsevier Interactive Patient Education [employer]2016 Elsevier Inc.

## 2015-11-26 ENCOUNTER — Encounter: Payer: Medicaid Other | Admitting: Obstetrics and Gynecology

## 2015-12-03 ENCOUNTER — Encounter: Payer: Medicaid Other | Admitting: Obstetrics and Gynecology

## 2015-12-30 ENCOUNTER — Other Ambulatory Visit: Payer: Self-pay | Admitting: Obstetrics and Gynecology

## 2015-12-30 ENCOUNTER — Ambulatory Visit (INDEPENDENT_AMBULATORY_CARE_PROVIDER_SITE_OTHER): Payer: Medicaid Other | Admitting: Obstetrics and Gynecology

## 2015-12-30 ENCOUNTER — Encounter: Payer: Self-pay | Admitting: Obstetrics and Gynecology

## 2015-12-30 DIAGNOSIS — N72 Inflammatory disease of cervix uteri: Secondary | ICD-10-CM

## 2015-12-30 MED ORDER — AZITHROMYCIN 500 MG PO TABS: 1000.0000 mg | ORAL_TABLET | Freq: Once | ORAL | 1 refills | Status: AC

## 2015-12-30 MED ORDER — METRONIDAZOLE 500 MG PO TABS: 500.0000 mg | ORAL_TABLET | Freq: Two times a day (BID) | ORAL | 0 refills | Status: DC

## 2015-12-30 NOTE — Patient Instructions (Signed)
  Place postpartum visit patient instructions here.  

## 2015-12-30 NOTE — Progress Notes (Signed)
  Subjective:     Nancy Orozco is a 20 y.o. female who presents for a postpartum visit. She is 6 weeks postpartum following a spontaneous vaginal delivery. I have fully reviewed the prenatal and intrapartum course. The delivery was at 36.6 gestational weeks. Outcome: spontaneous vaginal delivery. Anesthesia: none. Postpartum course has been uncomplicated. Baby's course has been uncomplicated. Baby is feeding by formula. Bleeding no bleeding. Bowel function is normal. Bladder function is normal. Patient is not sexually active. Contraception method is abstinence. Postpartum depression screening: negative.  The following portions of the patient's history were reviewed and updated as appropriate: allergies, current medications, past family history, past medical history, past social history, past surgical history and problem list.  Review of Systems A comprehensive review of systems was negative.   Objective:    BP 111/78   Pulse 69   Ht 5\' 5"  (1.651 m)   Wt 133 lb 8 oz (60.6 kg)   Breastfeeding? No   BMI 22.22 kg/m   General:  alert, cooperative and appears stated age   Breasts:  not indicated  Lungs: clear to auscultation bilaterally  Heart:  regular rate and rhythm, S1, S2 normal, no murmur, click, rub or gallop  Abdomen: soft, non-tender; bowel sounds normal; no masses,  no organomegaly   Vulva:  normal  Vagina: normal vagina, no discharge, exudate, lesion, or erythema  Cervix:  multiparous appearance  Corpus: normal size, contour, position, consistency, mobility, non-tender  Adnexa:  no mass, fullness, tenderness  Rectal Exam: Not performed.        Assessment:     6 weeks postpartum exam & Mirena insertion. Pap smear not done at today's visit.   Plan:    1. Contraception: IUD 2. mirena insertion -RTC in 6 weeks for IUD check & pap 3. Follow up in: 6 weeks or as needed.     Nancy Orozco is a 20 y.o. year old 31P0101 Hispanic female who presents for placement of a Mirena  IUD.  No LMP recorded. BP 111/78   Pulse 69   Ht 5\' 5"  (1.651 m)   Wt 133 lb 8 oz (60.6 kg)   Breastfeeding? No   BMI 22.22 kg/m    The risks and benefits of the method and placement have been thouroughly reviewed with the patient and all questions were answered.  Specifically the patient is aware of failure rate of 03/998, expulsion of the IUD and of possible perforation.  The patient is aware of irregular bleeding due to the method and understands the incidence of irregular bleeding diminishes with time.  Signed copy of informed consent in chart.   Time out was performed.  A graves speculum was placed in the vagina.  Thin yellow discharge noted. Microscopic wet-mount exam shows trichomonads, white blood cells, DNA probe for chlamydia and GC obtained. Explained findings to patient. Will have her return in one week for IUD insertion if results are clear.

## 2016-01-05 ENCOUNTER — Encounter: Payer: Self-pay | Admitting: Obstetrics and Gynecology

## 2016-01-05 ENCOUNTER — Telehealth: Payer: Self-pay | Admitting: Obstetrics and Gynecology

## 2016-01-08 ENCOUNTER — Ambulatory Visit: Payer: Medicaid Other | Admitting: Obstetrics and Gynecology

## 2016-01-09 ENCOUNTER — Ambulatory Visit (INDEPENDENT_AMBULATORY_CARE_PROVIDER_SITE_OTHER): Payer: Medicaid Other | Admitting: Obstetrics and Gynecology

## 2016-01-09 ENCOUNTER — Encounter: Payer: Self-pay | Admitting: Obstetrics and Gynecology

## 2016-01-09 VITALS — BP 104/70 | HR 72 | Ht 65.0 in | Wt 133.1 lb

## 2016-01-09 DIAGNOSIS — N760 Acute vaginitis: Secondary | ICD-10-CM

## 2016-01-09 DIAGNOSIS — Z30430 Encounter for insertion of intrauterine contraceptive device: Secondary | ICD-10-CM

## 2016-01-09 DIAGNOSIS — Z975 Presence of (intrauterine) contraceptive device: Secondary | ICD-10-CM

## 2016-01-09 DIAGNOSIS — B9689 Other specified bacterial agents as the cause of diseases classified elsewhere: Secondary | ICD-10-CM | POA: Diagnosis not present

## 2016-01-09 LAB — POCT URINE PREGNANCY: Preg Test, Ur: NEGATIVE

## 2016-01-09 MED ORDER — LEVONORGESTREL 20 MCG/24HR IU IUD: INTRAUTERINE_SYSTEM | Freq: Once | INTRAUTERINE | Status: DC

## 2016-01-09 MED ORDER — CLINDAMYCIN PHOSPHATE 2 % VA CREA: 1.0000 | TOPICAL_CREAM | Freq: Every day | VAGINAL | Status: DC

## 2016-01-09 NOTE — Progress Notes (Signed)
Etter SjogrenCindy Tomasik is a 20 y.o. year old 41P0101 Hispanic female who presents for placement of a Mirena IUD.  No LMP recorded. BP 104/70   Pulse 72   Ht 5\' 5"  (1.651 m)   Wt 133 lb 1.6 oz (60.4 kg)   Breastfeeding? No   BMI 22.15 kg/m  Last sexual intercourse was last week, and pregnancy test today was negative  The risks and benefits of the method and placement have been thouroughly reviewed with the patient and all questions were answered.  Specifically the patient is aware of failure rate of 03/998, expulsion of the IUD and of possible perforation.  The patient is aware of irregular bleeding due to the method and understands the incidence of irregular bleeding diminishes with time.  Signed copy of informed consent in chart.   Time out was performed.  A graves speculum was placed in the vagina.  The cervix was visualized, prepped using Betadine, and grasped with a single tooth tenaculum. The uterus was found to be neutral and it sounded to 9 cm.  The strings were trimmed to 3 cm.  Sonogram was performed and the proper placement of the IUD was verified via transvaginal u/s.   The patient was given post procedure instructions, including signs and symptoms of infection and to check for the strings after each menses or each month, and refraining from intercourse or anything in the vagina for 3 days.  She was given a Mirena care card with date Mirena placed, and date Mirena to be removed.    Kordae Buonocore Suzan NailerN Iliyana Convey, CNM

## 2016-01-09 NOTE — Patient Instructions (Signed)

## 2016-03-04 ENCOUNTER — Encounter: Payer: Medicaid Other | Admitting: Obstetrics and Gynecology

## 2016-03-08 NOTE — Telephone Encounter (Signed)
error 

## 2016-04-01 ENCOUNTER — Encounter: Payer: Medicaid Other | Admitting: Obstetrics and Gynecology

## 2016-04-23 ENCOUNTER — Encounter: Payer: Self-pay | Admitting: Obstetrics and Gynecology

## 2016-04-23 ENCOUNTER — Ambulatory Visit (INDEPENDENT_AMBULATORY_CARE_PROVIDER_SITE_OTHER): Payer: Medicaid Other | Admitting: Obstetrics and Gynecology

## 2016-04-23 VITALS — BP 123/62 | HR 96 | Ht 65.0 in | Wt 129.2 lb

## 2016-04-23 DIAGNOSIS — Z30431 Encounter for routine checking of intrauterine contraceptive device: Secondary | ICD-10-CM

## 2016-04-23 NOTE — Progress Notes (Signed)
Subjective:     Patient ID: Nancy Orozco, female   DOB: 05/29/1995, [age] y.o.   MRN: [id number]  HPI IUD placed October 2017, happy with use so far, reports spouse occasionally feels the string during sex. Is have light menses monthly.  Review of Systems negative    Objective:   Physical Exam A&O x4 Well groomed female in no distress Blood pressure 123/62, pulse 96, height 5\' 5"  (1.651 m), weight 129 lb 3.2 oz (58.6 kg), not currently breastfeeding. Pelvic exam: normal external genitalia, vulva, vagina, cervix, uterus and adnexa, IUD string noted.    Assessment:     IUD check    Plan:     To continue with IUD at this time RTC as needed.  Bertina Guthridge [place], CNM

## 2017-09-25 IMAGING — US US OB TRANSVAGINAL
1 series · 14 of 28 positions shown · non-contrast
Comparison: None.

CLINICAL DATA: Uncertain dates

EXAM:
OBSTETRIC <14 WK US AND TRANSVAGINAL OB US
TECHNIQUE: Both transabdominal and transvaginal ultrasound examinations were
performed for complete evaluation of the gestation as well as the
maternal uterus, adnexal regions, and pelvic cul-de-sac.
Transvaginal technique was performed to assess early pregnancy.

[Series 1: us ob transvaginal · 0.12mm/px · 14 of 109 slices shown]
[im 5/109]
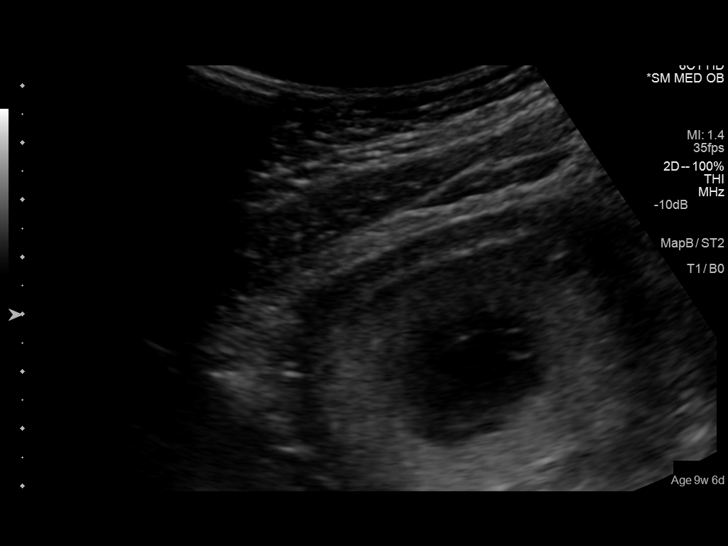
[im 13/109]
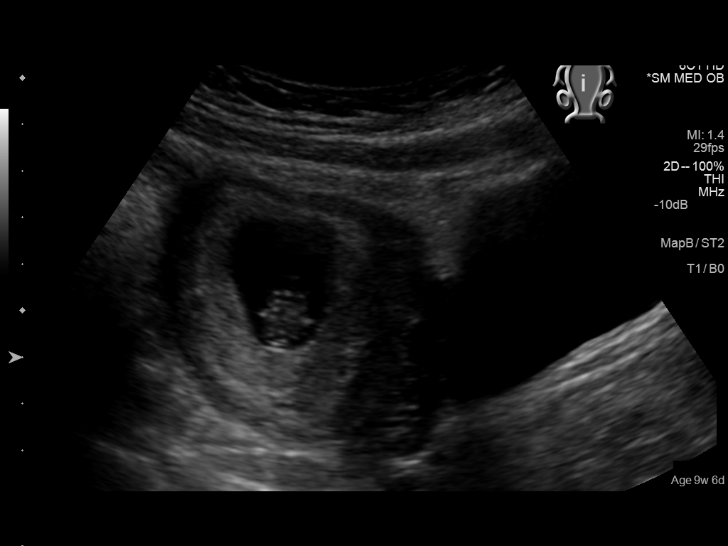
[im 21/109]
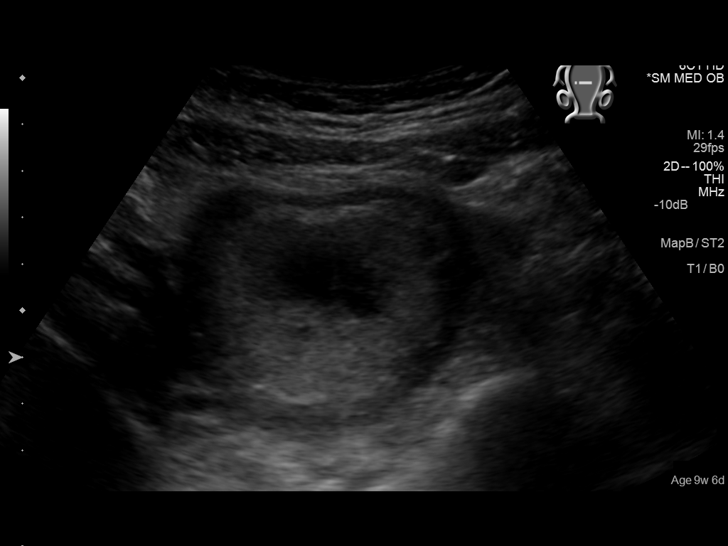
[im 29/109]
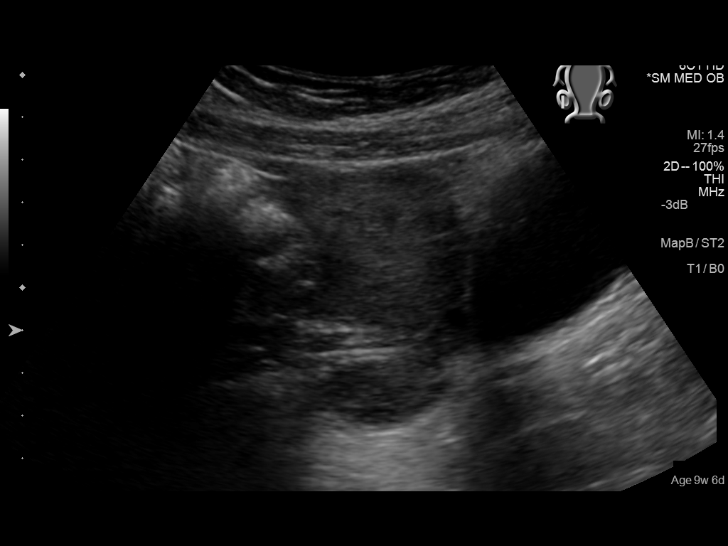
[im 37/109]
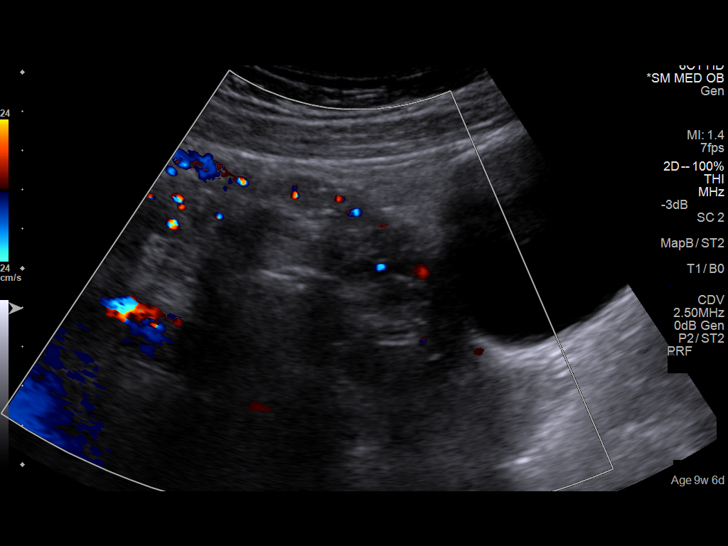
[im 45/109]
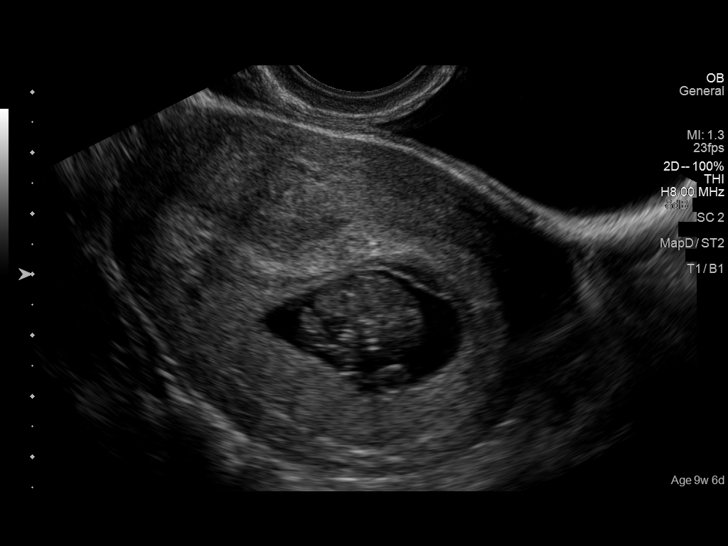
[im 53/109]
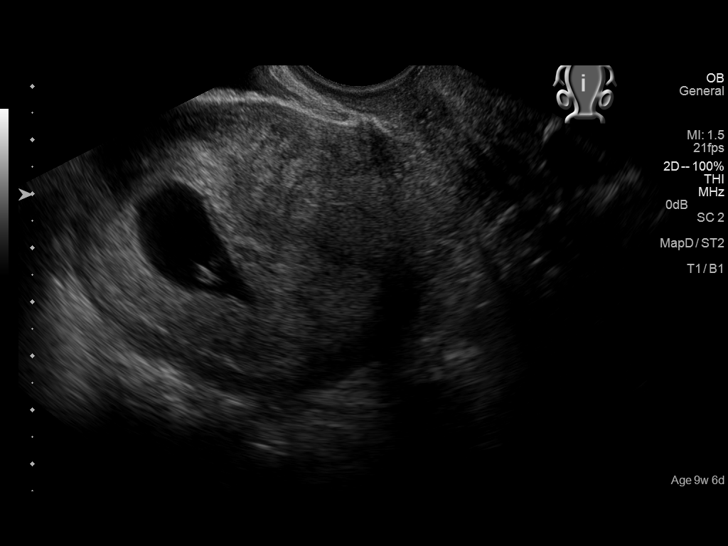
[im 61/109]
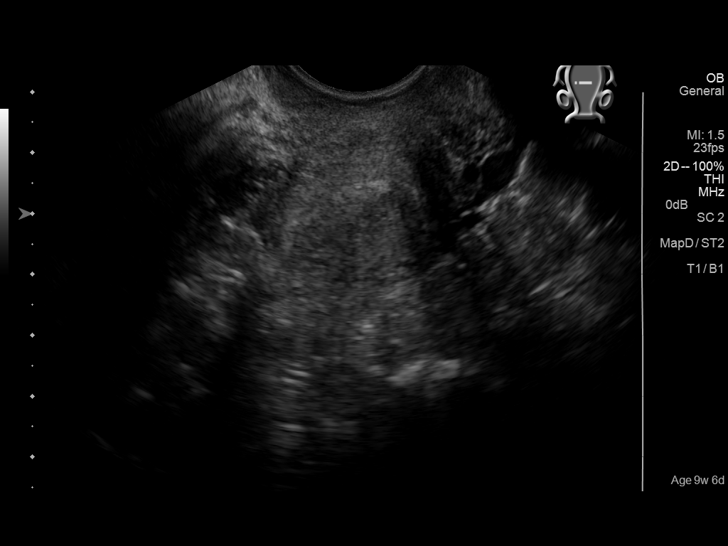
[im 69/109]
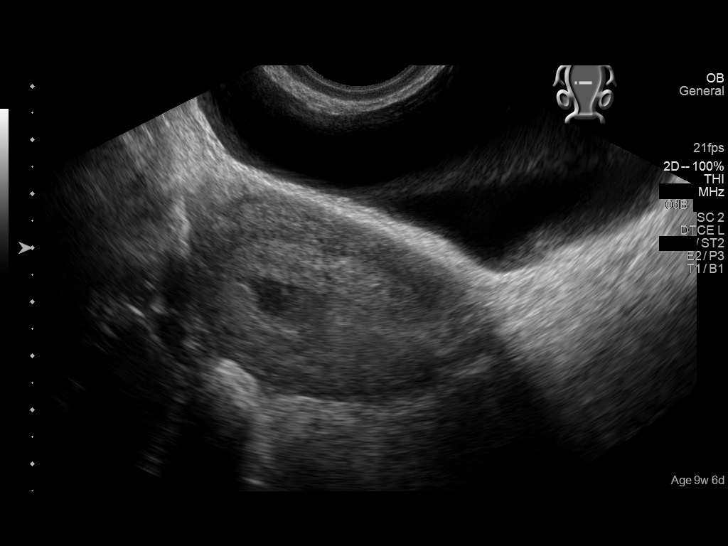
[im 77/109]
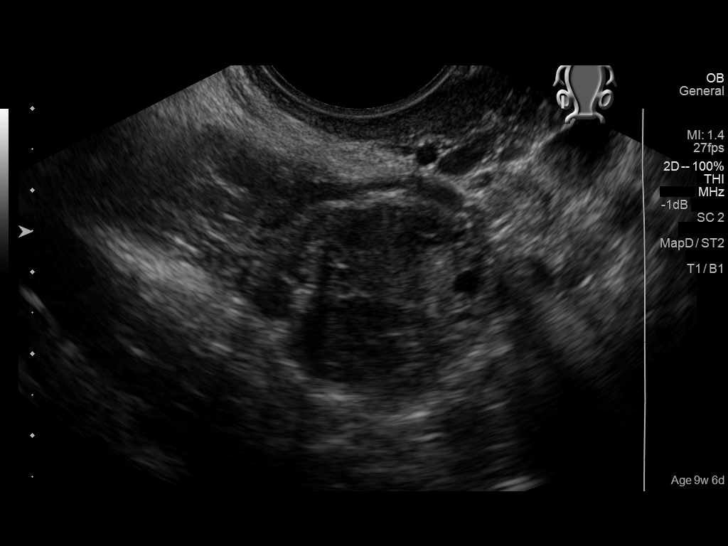
[im 85/109]
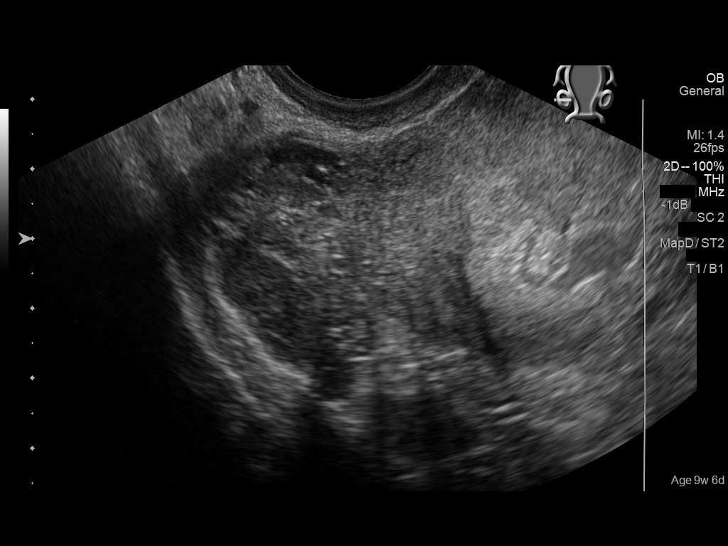
[im 93/109]
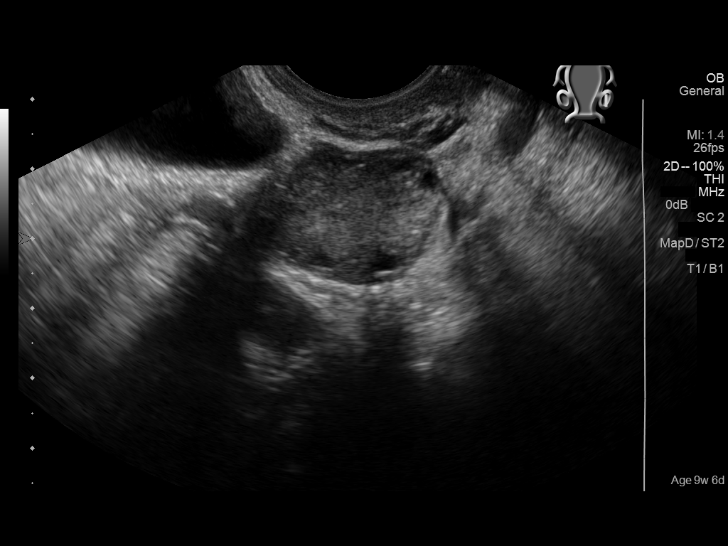
[im 101/109]
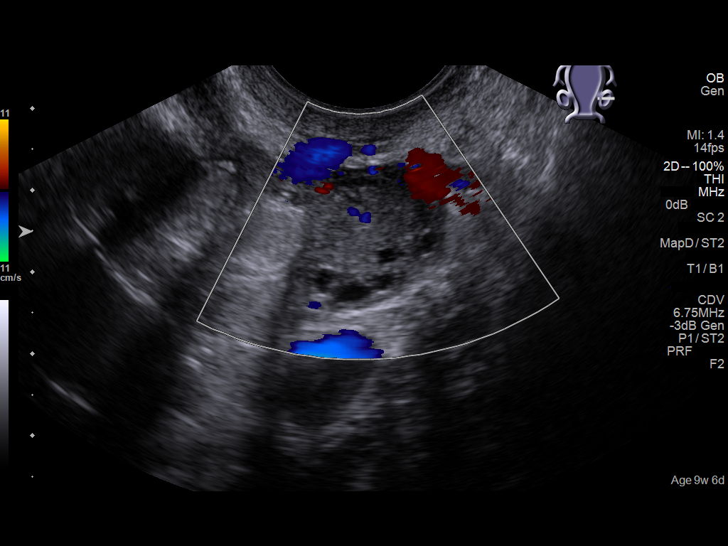
[im 109/109]
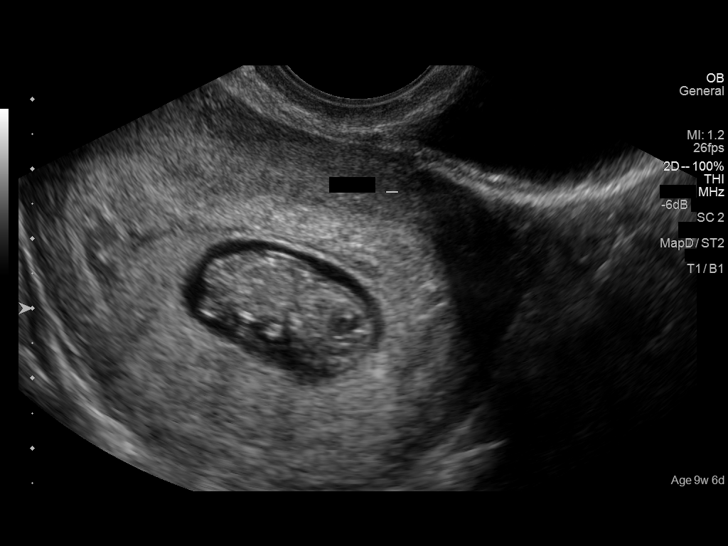

[14 of 28 positions shown; findings below may reference images not displayed]

FINDINGS: Intrauterine gestational sac: Visualized/normal in shape.

Yolk sac:  Visualized

Embryo:  Visualized

Cardiac Activity: Visualized

Heart Rate: 187  bpm

CRL:  27  mm   9 w   3 d                  US EDC: December 15, 2015

Subchorionic hemorrhage: There is a 7 x 5 x 4 mm subchorionic
hemorrhage.

Maternal uterus/adnexae: Cervical os is closed. Maternal ovarian
structures appear normal. There is trace free pelvic fluid.
IMPRESSION: Single live intrauterine gestation with estimated gestational age of
9+ weeks. Subcentimeter subchorionic hemorrhage. Trace maternal free
pelvic fluid may be physiologic.

## 2017-12-05 IMAGING — US US OB COMP +14 WK
1 series · 14 of 28 positions shown · non-contrast
Comparison: none

CLINICAL DATA: Pregnancy.

EXAM:
OBSTETRICAL ULTRASOUND >14 WKS

[Series 1: us ob comp +14 wk · 0.22mm/px · 14 of 102 slices shown]
[im 4/102]
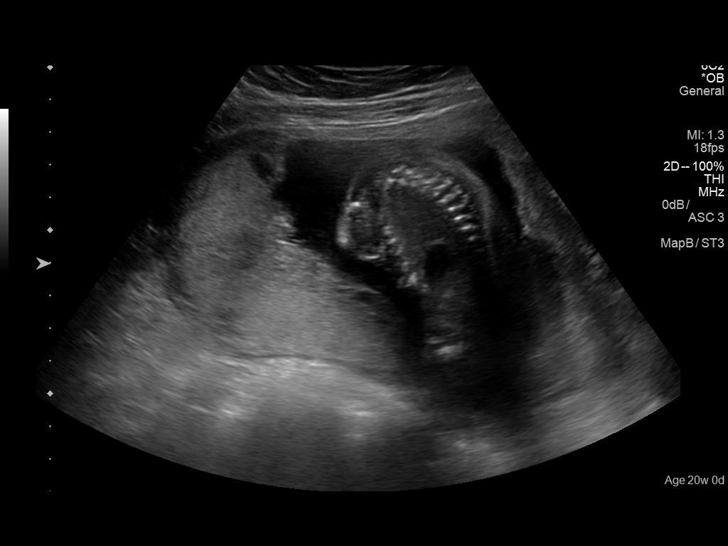
[im 12/102]
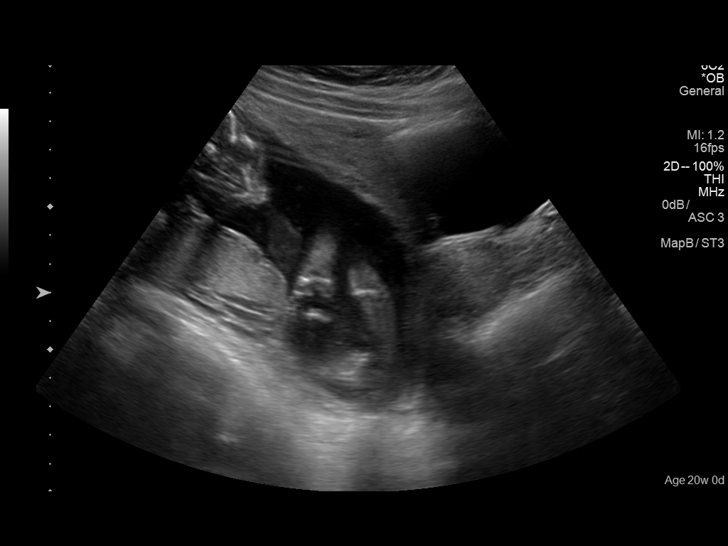
[im 19/102]
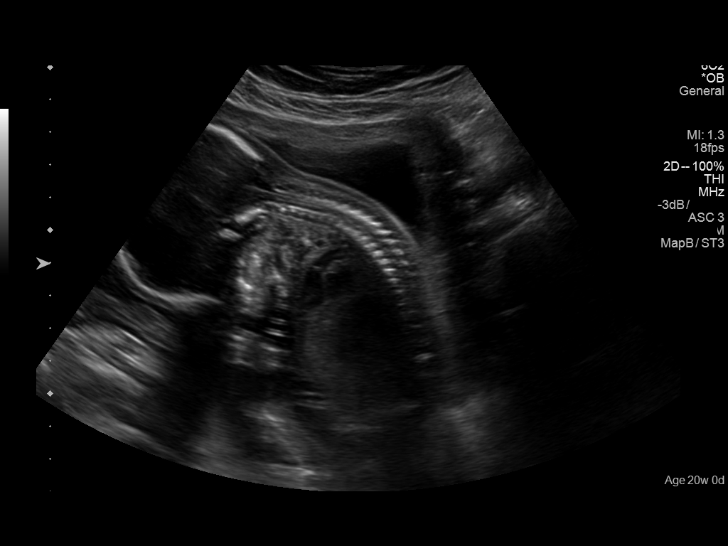
[im 27/102]
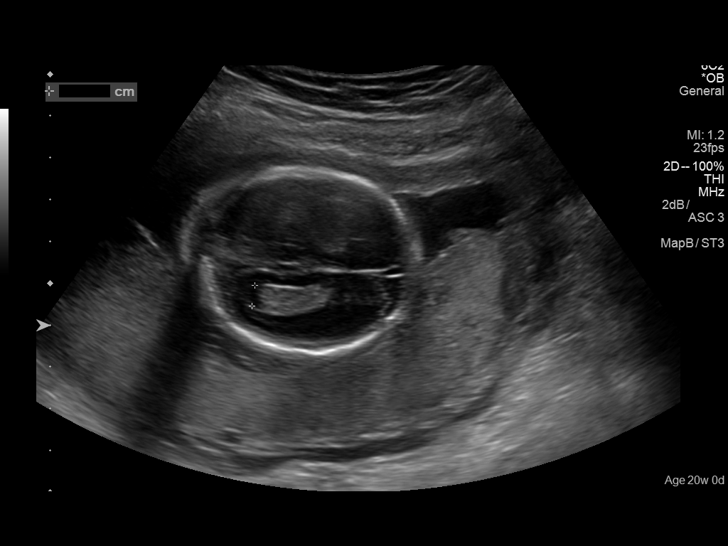
[im 34/102]
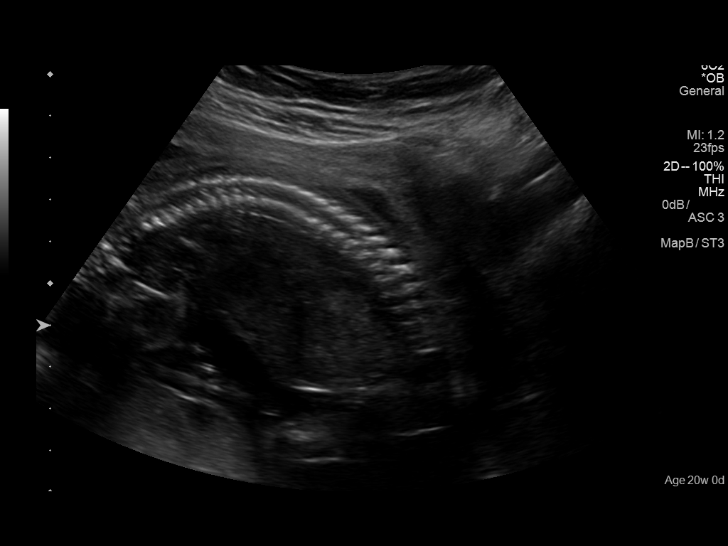
[im 42/102]
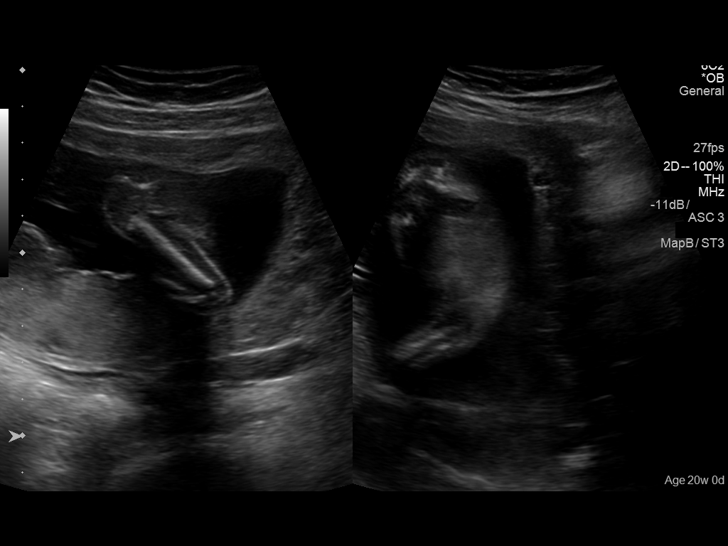
[im 49/102]
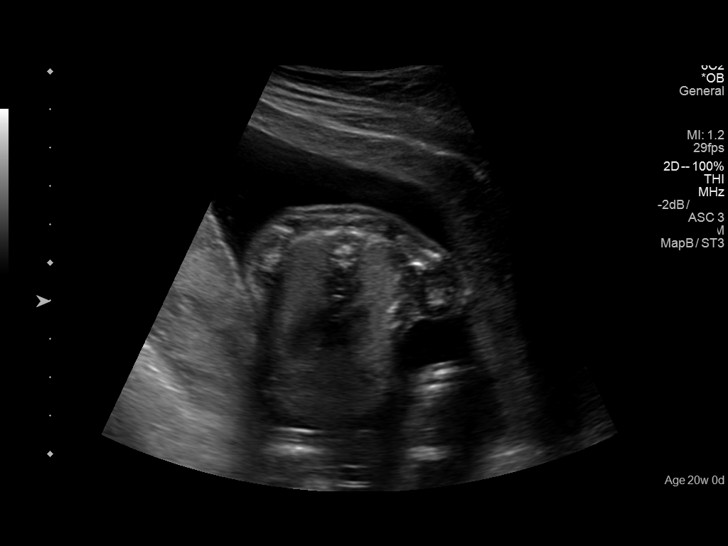
[im 57/102]
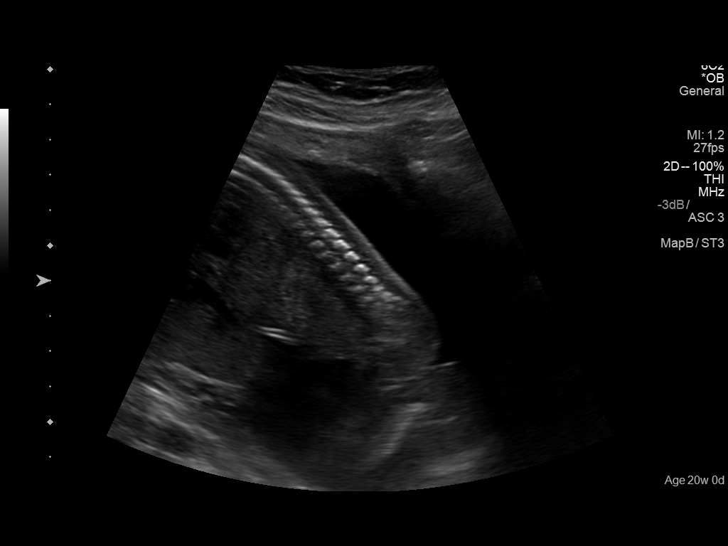
[im 64/102]
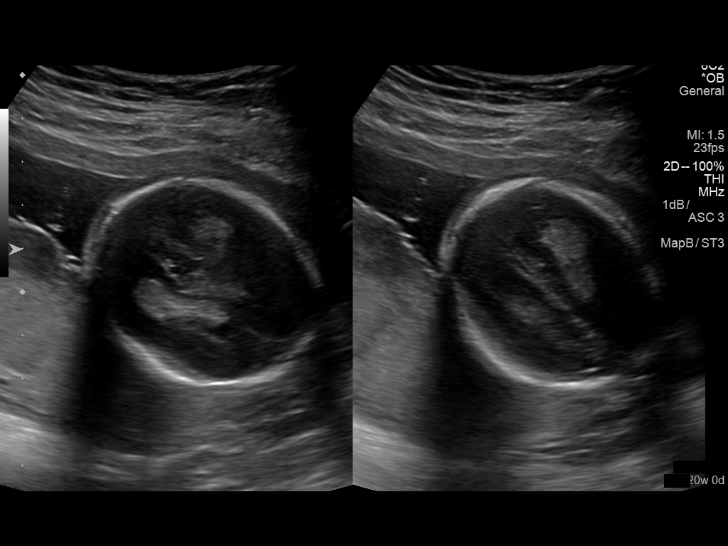
[im 72/102]
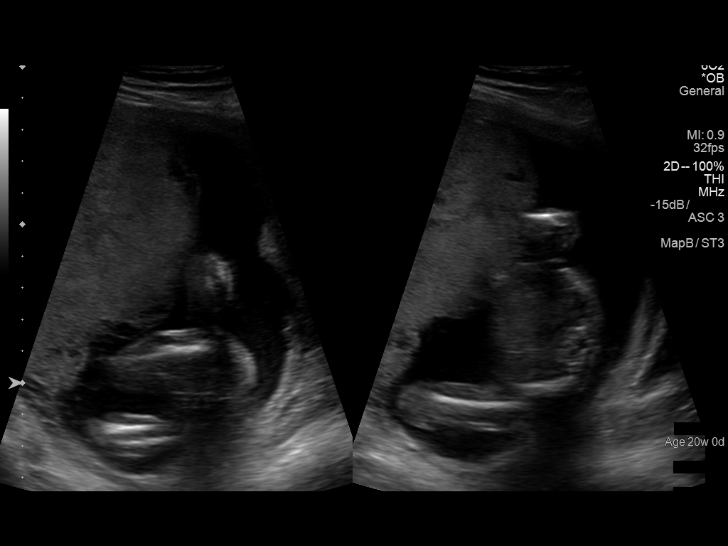
[im 79/102]
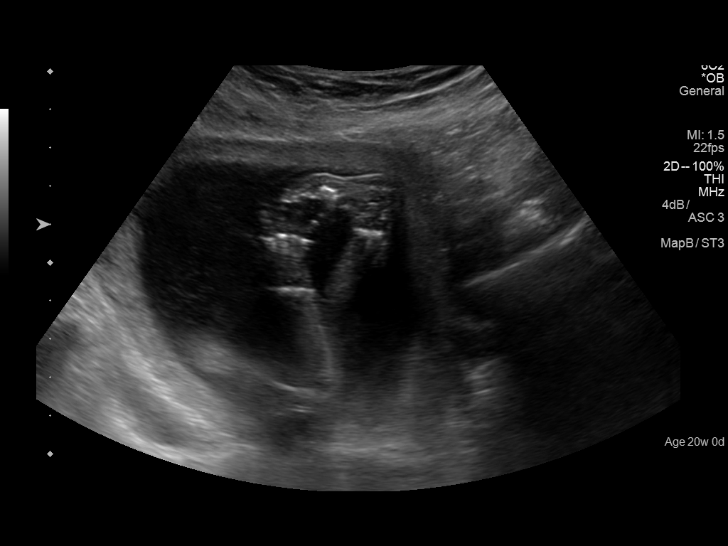
[im 87/102]
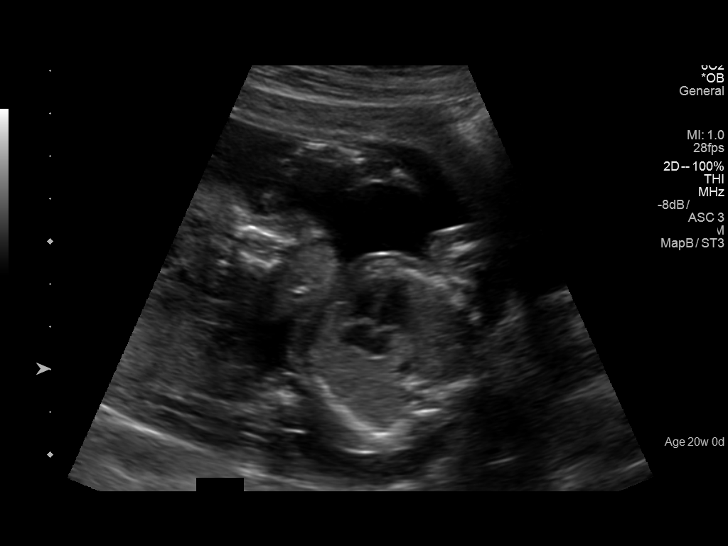
[im 94/102]
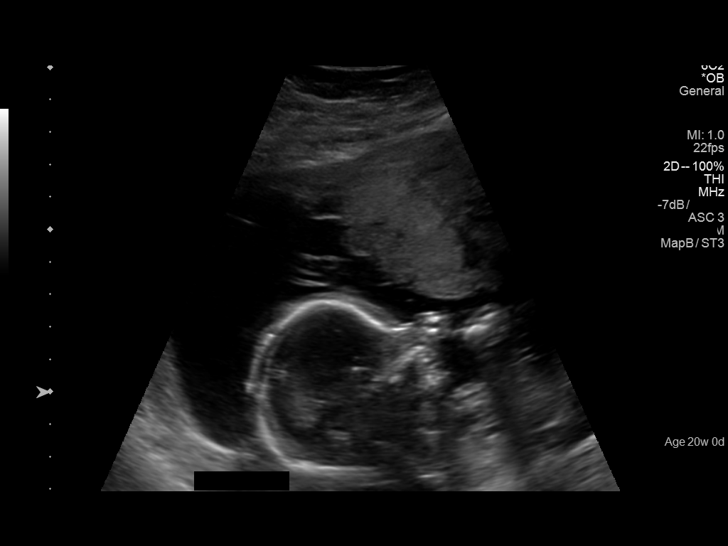
[im 102/102]
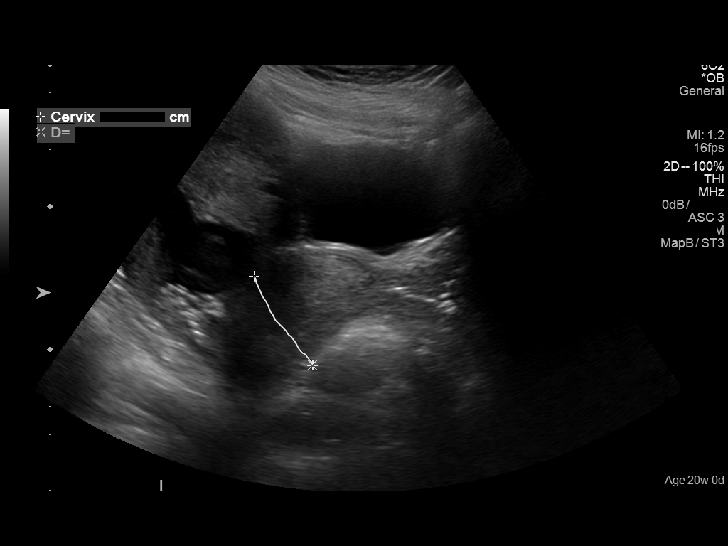

[14 of 28 positions shown; findings below may reference images not displayed]

FINDINGS: Number of Fetuses: 1

Heart Rate:  158 Bpm

Movement: Present

Presentation: Breech

Previa: No

Placental Location: Posterior fundal

Amniotic Fluid (Subjective): Normal

Amniotic Fluid (Objective):

Vertical pocket 4.3cm

FETAL BIOMETRY

BPD:  4.6cm 19w 5d

HC:    17.2cm  19w   5d

AC:   14.8cm  20w   0d

FL:   32.0cm  20w   0d

Current Mean GA: 19w 6d              US EDC: 12/13/2015

FETAL ANATOMY

Lateral Ventricles: Visualize

Thalami/CSP: Visualize

Posterior Fossa:  Visualized

Nuchal Region: Visualized    NFT= 5.0mm

Upper Lip: Visualized

Spine: Limited visualization due to fetal position

4 Chamber Heart on Left: Visualized

LVOT: Visualized

RVOT: Visualized

Stomach on Left: Visualized

3 Vessel Cord: Visualized

Cord Insertion site: Visualized

Kidneys: Visualized

Bladder: Visualized

Extremities: Visualized

Technically difficult due to: Fetal position.

Maternal Findings:

Cervix:  3.8 cm and closed.
IMPRESSION: Single viable intrauterine pregnancy at 19 weeks 6 days. Pregnancy
is in breech presentation.

## 2019-06-20 ENCOUNTER — Encounter: Payer: Self-pay | Admitting: Internal Medicine

## 2019-06-20 ENCOUNTER — Other Ambulatory Visit: Payer: Self-pay

## 2019-06-20 ENCOUNTER — Ambulatory Visit: Payer: Self-pay | Admitting: Internal Medicine

## 2019-06-20 VITALS — BP 122/80 | HR 66 | Resp 12 | Ht 62.0 in | Wt 108.0 lb

## 2019-06-20 DIAGNOSIS — T7491XA Unspecified adult maltreatment, confirmed, initial encounter: Secondary | ICD-10-CM

## 2019-06-20 DIAGNOSIS — Z975 Presence of (intrauterine) contraceptive device: Secondary | ICD-10-CM
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
## 2019-07-04 ENCOUNTER — Other Ambulatory Visit: Payer: Self-pay | Admitting: Clinical

## 2019-08-10 ENCOUNTER — Other Ambulatory Visit: Payer: Self-pay

## 2019-08-10 ENCOUNTER — Other Ambulatory Visit (INDEPENDENT_AMBULATORY_CARE_PROVIDER_SITE_OTHER): Payer: Self-pay

## 2019-08-10 DIAGNOSIS — Z0000 Encounter for general adult medical examination without abnormal findings: Secondary | ICD-10-CM

## 2019-08-10 DIAGNOSIS — Z13220 Encounter for screening for lipoid disorders: Secondary | ICD-10-CM

## 2019-08-11 LAB — CBC WITH DIFFERENTIAL/PLATELET
Basophils Absolute: 0.1 10*3/uL (ref 0.0–0.2)
Basos: 1 %
EOS (ABSOLUTE): 0.1 10*3/uL (ref 0.0–0.4)
Eos: 1 %
Hematocrit: 43.2 % (ref 34.0–46.6)
Hemoglobin: 14.4 g/dL (ref 11.1–15.9)
Immature Grans (Abs): 0 10*3/uL (ref 0.0–0.1)
Immature Granulocytes: 1 %
Lymphocytes Absolute: 1.8 10*3/uL (ref 0.7–3.1)
Lymphs: 28 %
MCH: 29.5 pg (ref 26.6–33.0)
MCHC: 33.3 g/dL (ref 31.5–35.7)
MCV: 89 fL (ref 79–97)
Monocytes Absolute: 0.5 10*3/uL (ref 0.1–0.9)
Monocytes: 7 %
Neutrophils Absolute: 3.9 10*3/uL (ref 1.4–7.0)
Neutrophils: 62 %
Platelets: 216 10*3/uL (ref 150–450)
RBC: 4.88 x10E6/uL (ref 3.77–5.28)
RDW: 13.5 % (ref 11.7–15.4)
WBC: 6.3 10*3/uL (ref 3.4–10.8)

## 2019-08-11 LAB — COMPREHENSIVE METABOLIC PANEL
ALT: 9 IU/L (ref 0–32)
AST: 13 IU/L (ref 0–40)
Albumin/Globulin Ratio: 1.6 (ref 1.2–2.2)
Albumin: 4.5 g/dL (ref 3.9–5.0)
Alkaline Phosphatase: 73 IU/L (ref 39–117)
BUN/Creatinine Ratio: 8 — ABNORMAL LOW (ref 9–23)
BUN: 6 mg/dL (ref 6–20)
Bilirubin Total: 0.4 mg/dL (ref 0.0–1.2)
CO2: 24 mmol/L (ref 20–29)
Calcium: 9.7 mg/dL (ref 8.7–10.2)
Chloride: 101 mmol/L (ref 96–106)
Creatinine, Ser: 0.76 mg/dL (ref 0.57–1.00)
GFR calc Af Amer: 126 mL/min/{1.73_m2} (ref >59)
GFR calc non Af Amer: 109 mL/min/{1.73_m2} (ref >59)
Globulin, Total: 2.9 g/dL (ref 1.5–4.5)
Glucose: 81 mg/dL (ref 65–99)
Potassium: 4.5 mmol/L (ref 3.5–5.2)
Sodium: 141 mmol/L (ref 134–144)
Total Protein: 7.4 g/dL (ref 6.0–8.5)

## 2019-08-11 LAB — LIPID PANEL W/O CHOL/HDL RATIO
Cholesterol, Total: 118 mg/dL (ref 100–199)
HDL: 44 mg/dL (ref >39)
LDL Chol Calc (NIH): 62 mg/dL (ref 0–99)
Triglycerides: 53 mg/dL (ref 0–149)
VLDL Cholesterol Cal: 12 mg/dL (ref 5–40)

## 2019-08-15 ENCOUNTER — Telehealth: Payer: Self-pay | Admitting: Internal Medicine

## 2019-08-15 NOTE — Telephone Encounter (Signed)
To Dr. Mulberry for further directions 

## 2019-08-15 NOTE — Telephone Encounter (Signed)
Patient called inquiring  lab results done last Friday. Apologies were given to patient for the delay and informed as well we have been very busy with COVID clinic but as soon as we get the she will be receiving a call back with results. Patient has My Chart set up already.

## 2019-09-19 ENCOUNTER — Other Ambulatory Visit: Payer: Self-pay

## 2019-09-19 DIAGNOSIS — R102 Pelvic and perineal pain: Secondary | ICD-10-CM

## 2019-09-19 DIAGNOSIS — R300 Dysuria: Secondary | ICD-10-CM

## 2019-09-19 LAB — POCT URINALYSIS DIPSTICK
Bilirubin, UA: NEGATIVE
Glucose, UA: NEGATIVE
Ketones, UA: NEGATIVE
Nitrite, UA: NEGATIVE
Protein, UA: NEGATIVE
Spec Grav, UA: 1.01 (ref 1.010–1.025)
Urobilinogen, UA: 0.2 E.U./dL
pH, UA: 6 (ref 5.0–8.0)

## 2019-09-19 LAB — POCT URINE PREGNANCY: Preg Test, Ur: NEGATIVE

## 2019-09-19 MED ORDER — PHENAZOPYRIDINE HCL 200 MG PO TABS
200.0000 mg | ORAL_TABLET | Freq: Three times a day (TID) | ORAL | 0 refills | Status: DC
Start: 2019-09-19 — End: 2020-08-07

## 2019-09-19 MED ORDER — CIPROFLOXACIN HCL 500 MG PO TABS
500.0000 mg | ORAL_TABLET | Freq: Two times a day (BID) | ORAL | 0 refills | Status: DC
Start: 2019-09-19 — End: 2020-08-07

## 2019-09-19 NOTE — Progress Notes (Signed)
Patient with Low pelvic pain, frequency and burning with urination x 7 days. Per Dr. Delrae Alfred send urine culture and start patient on Cipro 500 mg 1 twice a day x 3 days and pyridium 200 mg three times a day for 3 days until we get culture back. Patient verbalized understanding and would like Rx sent to [facility] in [place].

## 2019-09-20 ENCOUNTER — Other Ambulatory Visit: Payer: Self-pay

## 2019-09-21 LAB — URINE CULTURE

## 2019-10-03 ENCOUNTER — Encounter: Payer: Self-pay | Admitting: Internal Medicine

## 2019-10-03 DIAGNOSIS — Z975 Presence of (intrauterine) contraceptive device: Secondary | ICD-10-CM | POA: Insufficient documentation

## 2019-10-03 DIAGNOSIS — T7491XA Unspecified adult maltreatment, confirmed, initial encounter: Secondary | ICD-10-CM | POA: Insufficient documentation

## 2019-12-10 ENCOUNTER — Other Ambulatory Visit: Payer: Self-pay

## 2019-12-18 ENCOUNTER — Encounter: Payer: Self-pay | Admitting: Internal Medicine

## 2020-01-08 ENCOUNTER — Encounter: Payer: Self-pay | Admitting: Internal Medicine

## 2020-02-11 ENCOUNTER — Other Ambulatory Visit: Payer: Self-pay

## 2020-02-11 ENCOUNTER — Encounter: Payer: Self-pay | Admitting: Internal Medicine

## 2020-02-11 DIAGNOSIS — R319 Hematuria, unspecified: Secondary | ICD-10-CM

## 2020-02-11 LAB — POCT URINALYSIS DIPSTICK
Bilirubin, UA: NEGATIVE
Glucose, UA: NEGATIVE
Ketones, UA: NEGATIVE
Protein, UA: NEGATIVE
Spec Grav, UA: 1.015 (ref 1.010–1.025)
Urobilinogen, UA: 0.2 E.U./dL
pH, UA: 7 (ref 5.0–8.0)

## 2020-02-11 NOTE — Progress Notes (Signed)
Patient here with right low back pain and mild dysuria with frequency last couple of days.  Today after urinating, noted pink on the tissue and is not menstruating. No vaginal discharge, though at time with a strong odor. Does have an IUD still She states she did not take the Cipro ordered for similar symptoms that resolved back in June.  Only took the pyridium Discussed the pyridium only helps with the symptoms, but does not treat an infection. Her culture was ultimately negative then.  UA today with large blood and leuks.  Negative nitrite. Sending for culture. She will start the Cipro.

## 2020-02-17 LAB — URINE CULTURE

## 2020-04-14 ENCOUNTER — Encounter: Payer: Self-pay | Admitting: Internal Medicine

## 2020-04-28 ENCOUNTER — Telehealth: Payer: Self-pay | Admitting: Internal Medicine

## 2020-04-28 NOTE — Telephone Encounter (Signed)
Patient called asking for advise. Patient tested positive for COVID on 04/19/2020. Patient already completed her quarantine but continues having symptoms, including cough, bodyache and chest congestion. Patient stated that she has concerns of pneumonia because she felt dizzy Thursday and Friday.  Patient would like an appointment to see you. Please advise.

## 2020-04-29 NOTE — Telephone Encounter (Signed)
See if can get her in for me to listen to her lungs outside tomorrow as an illness.

## 2020-04-30 ENCOUNTER — Encounter: Payer: Self-pay | Admitting: Internal Medicine

## 2020-04-30 ENCOUNTER — Ambulatory Visit: Payer: Self-pay | Admitting: Internal Medicine

## 2020-04-30 ENCOUNTER — Other Ambulatory Visit: Payer: Self-pay

## 2020-04-30 VITALS — HR 72

## 2020-04-30 DIAGNOSIS — U071 COVID-19: Secondary | ICD-10-CM

## 2020-04-30 NOTE — Telephone Encounter (Signed)
Patient was seen on 04/30/2020.

## 2020-04-30 NOTE — Progress Notes (Signed)
° ° °  Subjective:    Patient ID: Nancy Orozco, female   DOB: April 07, 1995, 25 y.o.   MRN: [id number]   HPI Here as continues to have symptoms of cough, bodyache, chest congestion with COVID.  Tested positive on 04/19/2020.    No outpatient medications have been marked as taking for the 04/30/20 encounter (Office Visit) with Julieanne Manson, MD.   No Known Allergies   Review of Systems    Objective:   Pulse 72    SpO2 99%   Physical Exam  Patient seen outside in parking lot in her car NAD HEENT:  PERRL, EOMI, Neck: Supple, No adenopathy Chest:  CTA no wheeze.  Good air movement.  No tachypnea CV:  RRR without murmur or rub.  Radial pulses normal and equ   Assessment & Plan    COVID 19:  Appears to be recovering nicely.  Recommended symptomatic treatment for cough, perhaps Dayquil as needed.  Push fluids to better handle secretions.  Discussed sometimes people with slower recovery.  If does not continue to improve, to call again.

## 2020-06-30 ENCOUNTER — Telehealth: Payer: Self-pay | Admitting: Internal Medicine

## 2020-06-30 NOTE — Telephone Encounter (Signed)
Patient called asking for a COVID vaccine. Patient stated that her daughter tested positive for COVID last Monday. Patient stated to have COVID symptoms on 06/24/2020 including fever, runny nose and lost of taste and smell. Patient would like to know when can she get vaccinated for COVID-19 (first shot) since she still have runny nose and loss of taste. Please advise.

## 2020-06-30 NOTE — Telephone Encounter (Signed)
Per recommendation of Dr. Delrae Alfred, patient will call whenever she is without symptoms or she will come any day during the afternoon to get her her first COVID vaccine.

## 2020-07-07 ENCOUNTER — Telehealth: Payer: Self-pay | Admitting: Internal Medicine

## 2020-07-07 NOTE — Telephone Encounter (Signed)
Patient stated that she is feeling edgy and anxious, she has some history of panic attacks and wanted to ask if you can prescribed Zoloft for her anxiety because she sawed people on youtube recommending that medicine.   After consulting with Dr. Delrae Alfred, she recommended to placed patient appointment on the waiting list for a cancellation. Other she will have to keep her appointment.

## 2020-08-07 ENCOUNTER — Other Ambulatory Visit: Payer: Self-pay | Admitting: Internal Medicine

## 2020-08-07 ENCOUNTER — Encounter: Payer: Self-pay | Admitting: Internal Medicine

## 2020-08-07 ENCOUNTER — Ambulatory Visit: Payer: Self-pay | Admitting: Internal Medicine

## 2020-08-07 ENCOUNTER — Other Ambulatory Visit: Payer: Self-pay

## 2020-08-07 VITALS — BP 100/70 | HR 84 | Resp 20 | Ht 62.5 in | Wt 109.5 lb

## 2020-08-07 DIAGNOSIS — F410 Panic disorder [episodic paroxysmal anxiety] without agoraphobia: Secondary | ICD-10-CM

## 2020-08-07 DIAGNOSIS — Z124 Encounter for screening for malignant neoplasm of cervix: Secondary | ICD-10-CM

## 2020-08-07 DIAGNOSIS — F419 Anxiety disorder, unspecified: Secondary | ICD-10-CM

## 2020-08-07 DIAGNOSIS — F32A Depression, unspecified: Secondary | ICD-10-CM

## 2020-08-07 DIAGNOSIS — Z0000 Encounter for general adult medical examination without abnormal findings: Secondary | ICD-10-CM

## 2020-08-07 MED ORDER — SERTRALINE HCL 50 MG PO TABS: ORAL_TABLET | ORAL | 3 refills | Status: DC
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
## 2020-08-08 LAB — CBC WITH DIFFERENTIAL/PLATELET
Basophils Absolute: 0.1 10*3/uL (ref 0.0–0.2)
Basos: 1 %
EOS (ABSOLUTE): 0.3 10*3/uL (ref 0.0–0.4)
Eos: 5 %
Hematocrit: 45.9 % (ref 34.0–46.6)
Hemoglobin: 15.8 g/dL (ref 11.1–15.9)
Immature Grans (Abs): 0 10*3/uL (ref 0.0–0.1)
Immature Granulocytes: 0 %
Lymphocytes Absolute: 1.4 10*3/uL (ref 0.7–3.1)
Lymphs: 21 %
MCH: 31 pg (ref 26.6–33.0)
MCHC: 34.4 g/dL (ref 31.5–35.7)
MCV: 90 fL (ref 79–97)
Monocytes Absolute: 0.5 10*3/uL (ref 0.1–0.9)
Monocytes: 7 %
Neutrophils Absolute: 4.4 10*3/uL (ref 1.4–7.0)
Neutrophils: 66 %
Platelets: 251 10*3/uL (ref 150–450)
RBC: 5.1 x10E6/uL (ref 3.77–5.28)
RDW: 14 % (ref 11.7–15.4)
WBC: 6.6 10*3/uL (ref 3.4–10.8)

## 2020-08-08 LAB — COMPREHENSIVE METABOLIC PANEL
ALT: 8 IU/L (ref 0–32)
AST: 15 IU/L (ref 0–40)
Albumin/Globulin Ratio: 2 (ref 1.2–2.2)
Albumin: 5.4 g/dL — ABNORMAL HIGH (ref 3.9–5.0)
Alkaline Phosphatase: 81 IU/L (ref 44–121)
BUN/Creatinine Ratio: 14 (ref 9–23)
BUN: 11 mg/dL (ref 6–20)
Bilirubin Total: 0.4 mg/dL (ref 0.0–1.2)
CO2: 21 mmol/L (ref 20–29)
Calcium: 10 mg/dL (ref 8.7–10.2)
Chloride: 103 mmol/L (ref 96–106)
Creatinine, Ser: 0.81 mg/dL (ref 0.57–1.00)
Globulin, Total: 2.7 g/dL (ref 1.5–4.5)
Glucose: 80 mg/dL (ref 65–99)
Potassium: 4.7 mmol/L (ref 3.5–5.2)
Sodium: 142 mmol/L (ref 134–144)
Total Protein: 8.1 g/dL (ref 6.0–8.5)
eGFR: 103 mL/min/{1.73_m2} (ref >59)

## 2020-08-08 LAB — LIPID PANEL W/O CHOL/HDL RATIO
Cholesterol, Total: 146 mg/dL (ref 100–199)
HDL: 59 mg/dL (ref >39)
LDL Chol Calc (NIH): 76 mg/dL (ref 0–99)
Triglycerides: 52 mg/dL (ref 0–149)
VLDL Cholesterol Cal: 11 mg/dL (ref 5–40)

## 2020-08-08 LAB — TSH: TSH: 1.74 u[IU]/mL (ref 0.450–4.500)

## 2020-08-12 LAB — CYTOLOGY - PAP

## 2020-08-13 ENCOUNTER — Other Ambulatory Visit: Payer: Self-pay | Admitting: Clinical

## 2020-10-01 ENCOUNTER — Ambulatory Visit: Payer: Self-pay | Admitting: Internal Medicine

## 2020-11-19 ENCOUNTER — Ambulatory Visit: Payer: Self-pay | Admitting: Internal Medicine

## 2020-12-14 ENCOUNTER — Other Ambulatory Visit: Payer: Self-pay

## 2020-12-14 ENCOUNTER — Emergency Department
Admission: EM | Admit: 2020-12-14 | Discharge: 2020-12-14 | Disposition: A | Discharge status: Home / Self Care | Payer: Commercial Managed Care - PPO | Attending: Family Medicine | Admitting: Family Medicine

## 2020-12-14 DIAGNOSIS — R000 Tachycardia, unspecified: Secondary | ICD-10-CM | POA: Insufficient documentation

## 2020-12-14 DIAGNOSIS — Z5321 Procedure and treatment not carried out due to patient leaving prior to being seen by health care provider: Secondary | ICD-10-CM | POA: Insufficient documentation

## 2020-12-14 DIAGNOSIS — H538 Other visual disturbances: Secondary | ICD-10-CM | POA: Insufficient documentation

## 2020-12-14 DIAGNOSIS — R079 Chest pain, unspecified: Secondary | ICD-10-CM | POA: Diagnosis not present

## 2020-12-14 LAB — CBG MONITORING, ED: Glucose-Capillary: 88 mg/dL (ref 70–99)

## 2020-12-14 NOTE — ED Triage Notes (Signed)
Pt states this week has been having sensation of blurred vision and tachycardia intermittently. Pt appears in no acute distress, pt states she has had chest pain this week as well.

## 2020-12-14 NOTE — ED Provider Notes (Signed)
Emergency Medicine Provider Triage Evaluation Note  Nancy Orozco , a 25 y.o. female  was evaluated in triage.  Pt complains of rapid heart rate, blurred vision off and on for the past week.  Review of Systems  Positive: Tachycardia Negative: Shortness of breath  Physical Exam  BP (!) 128/93   Pulse 90   Resp 16   Ht 5\' 5"  (1.651 m)   Wt 51.3 kg   SpO2 100%   BMI 18.80 kg/m  Gen:   Awake, no distress   Resp:  Normal effort  MSK:   Moves extremities without difficulty  Other:    Medical Decision Making  Medically screening exam initiated at 7:48 PM.  Appropriate orders placed.  Andra Heslin was informed that the remainder of the evaluation will be completed by another provider, this initial triage assessment does not replace that evaluation, and the importance of remaining in the ED until their evaluation is complete.   Etter Sjogren, FNP 12/14/20 1950    12/16/20, MD 12/15/20 1004

## 2020-12-18 ENCOUNTER — Telehealth: Payer: Self-pay

## 2020-12-18 NOTE — Telephone Encounter (Signed)
Pt called to ask for help interpreting EKG done during her recent ER visit 12/14/20. Pt went in for issues with heart rate and blurred vision. She was only seen by a nurse to get EKG done, but had to leave for work the next day. Was not further evaluated and is unsure of results and what she should do next

## 2020-12-22 NOTE — Telephone Encounter (Signed)
ECGs looked fine.  If continued problems--appt

## 2020-12-23 NOTE — Telephone Encounter (Signed)
Voicemail left to call back 

## 2020-12-23 NOTE — Telephone Encounter (Signed)
Pt notified. Would like to be seen sooner as issues have been reoccurring

## 2020-12-24 NOTE — Telephone Encounter (Signed)
Offered two appts 9/28, patient did not want to come in

## 2021-01-01 NOTE — Telephone Encounter (Signed)
Pt would like to keep her original appt

## 2021-01-21 ENCOUNTER — Ambulatory Visit: Payer: Self-pay | Admitting: Internal Medicine

## 2021-02-18 ENCOUNTER — Ambulatory Visit: Payer: Self-pay | Admitting: Internal Medicine

## 2021-11-11 ENCOUNTER — Other Ambulatory Visit: Payer: Self-pay | Admitting: Internal Medicine

## 2021-11-16 ENCOUNTER — Other Ambulatory Visit: Payer: Self-pay

## 2021-11-16 MED ORDER — SERTRALINE HCL 50 MG PO TABS: ORAL_TABLET | ORAL | 1 refills | Status: DC

## 2022-01-05 ENCOUNTER — Ambulatory Visit: Payer: Self-pay | Admitting: Internal Medicine

## 2022-02-09 ENCOUNTER — Ambulatory Visit: Payer: Self-pay | Admitting: Internal Medicine

## 2022-08-27 ENCOUNTER — Telehealth: Payer: Self-pay

## 2022-08-27 NOTE — Telephone Encounter (Signed)
Patient called because she is concerned she may have hemorrhoids. For the past week patient has been experiencing rectal pain. Describes having pressure on the inside of rectum and itchy discomfort on the outside. She has also noticed blood when she wipes but is not sure if it is due to recent removal of IUD. Patient has also had constipation during the past few weeks. Patient has not taken any medication for this complaint. Patient also believes she may have GERD. Has a could and burning in her throat every morning for the past 5 months. Patient has not taken any medication for this complaint.

## 2022-09-01 ENCOUNTER — Telehealth: Payer: Self-pay

## 2022-09-01 NOTE — Telephone Encounter (Signed)
Patient would like an appointment on a Wed after 5pm for reflux. We will can once we can get her an appointment.

## 2022-09-01 NOTE — Telephone Encounter (Signed)
Patient has been notified of recommendations. Patient put on wait list for appointment as she only want appointments after 5pm

## 2022-09-06 ENCOUNTER — Ambulatory Visit: Payer: Self-pay | Admitting: Internal Medicine

## 2022-09-08 NOTE — Telephone Encounter (Signed)
Offered patient an appointment but she is unable to make it.

## 2022-10-11 NOTE — Telephone Encounter (Signed)
Patient has been scheduled

## 2022-10-13 ENCOUNTER — Ambulatory Visit: Payer: Self-pay | Admitting: Internal Medicine

## 2022-11-16 ENCOUNTER — Other Ambulatory Visit: Payer: Self-pay | Admitting: Internal Medicine

## 2022-11-19 ENCOUNTER — Telehealth: Payer: Self-pay

## 2022-11-19 NOTE — Telephone Encounter (Signed)
Patient called request refill on Sertraline 50mg . Patient has not been seen since 08/07/20

## 2022-11-19 NOTE — Telephone Encounter (Signed)
Patient has been scheduled in December. Patient only wanted an appointment on a Wednesday after 5pm.

## 2022-11-19 NOTE — Telephone Encounter (Signed)
Many cancellations after we refill med. Has not been seen for almost 2 years. Will not refill until she comes in to be seen. Will require her to be seen on yearly basis without exception to refill her med.

## 2023-01-05 ENCOUNTER — Encounter (HOSPITAL_COMMUNITY): Payer: Self-pay | Admitting: Emergency Medicine

## 2023-01-05 ENCOUNTER — Inpatient Hospital Stay (HOSPITAL_COMMUNITY)
Admission: AD | Admit: 2023-01-05 | Discharge: 2023-01-05 | Disposition: A | Discharge status: Home / Self Care | Payer: Medicaid Other | Attending: Obstetrics and Gynecology | Admitting: Obstetrics and Gynecology

## 2023-01-05 ENCOUNTER — Inpatient Hospital Stay (HOSPITAL_COMMUNITY): Payer: Medicaid Other

## 2023-01-05 ENCOUNTER — Other Ambulatory Visit: Payer: Self-pay

## 2023-01-05 DIAGNOSIS — O200 Threatened abortion: Secondary | ICD-10-CM

## 2023-01-05 DIAGNOSIS — Z3A08 8 weeks gestation of pregnancy: Secondary | ICD-10-CM | POA: Diagnosis not present

## 2023-01-05 DIAGNOSIS — Z3A01 Less than 8 weeks gestation of pregnancy: Secondary | ICD-10-CM

## 2023-01-05 DIAGNOSIS — N939 Abnormal uterine and vaginal bleeding, unspecified: Secondary | ICD-10-CM | POA: Diagnosis not present

## 2023-01-05 HISTORY — DX: Urinary tract infection, site not specified: N39.0

## 2023-01-05 HISTORY — DX: Cardiac arrhythmia, unspecified: I49.9

## 2023-01-05 HISTORY — DX: Headache, unspecified: R51.9

## 2023-01-05 HISTORY — DX: Anxiety disorder, unspecified: F41.9

## 2023-01-05 LAB — WET PREP, GENITAL
Clue Cells Wet Prep HPF POC: NONE SEEN
Sperm: NONE SEEN
Trich, Wet Prep: NONE SEEN
WBC, Wet Prep HPF POC: >=10 — AB (ref <10)
Yeast Wet Prep HPF POC: NONE SEEN

## 2023-01-05 LAB — COMPREHENSIVE METABOLIC PANEL
ALT: 42 U/L (ref 0–44)
AST: 31 U/L (ref 15–41)
Albumin: 4.4 g/dL (ref 3.5–5.0)
Alkaline Phosphatase: 55 U/L (ref 38–126)
Anion gap: 11 (ref 5–15)
BUN: 9 mg/dL (ref 6–20)
CO2: 25 mmol/L (ref 22–32)
Calcium: 9.9 mg/dL (ref 8.9–10.3)
Chloride: 104 mmol/L (ref 98–111)
Creatinine, Ser: 0.77 mg/dL (ref 0.44–1.00)
GFR, Estimated: >60 mL/min (ref >60)
Glucose, Bld: 91 mg/dL (ref 70–99)
Potassium: 3.9 mmol/L (ref 3.5–5.1)
Sodium: 140 mmol/L (ref 135–145)
Total Bilirubin: 0.6 mg/dL (ref 0.3–1.2)
Total Protein: 7.7 g/dL (ref 6.5–8.1)

## 2023-01-05 LAB — CBC
HCT: 44.4 % (ref 36.0–46.0)
Hemoglobin: 14.6 g/dL (ref 12.0–15.0)
MCH: 28.7 pg (ref 26.0–34.0)
MCHC: 32.9 g/dL (ref 30.0–36.0)
MCV: 87.4 fL (ref 80.0–100.0)
Platelets: 250 10*3/uL (ref 150–400)
RBC: 5.08 MIL/uL (ref 3.87–5.11)
RDW: 13.4 % (ref 11.5–15.5)
WBC: 7.2 10*3/uL (ref 4.0–10.5)
nRBC: 0 % (ref 0.0–0.2)

## 2023-01-05 LAB — HCG, QUANTITATIVE, PREGNANCY: hCG, Beta Chain, Quant, S: 10371 m[IU]/mL — ABNORMAL HIGH (ref <5)

## 2023-01-05 LAB — ABO/RH: ABO/RH(D): O POS

## 2023-01-05 LAB — POC URINE PREG, ED: Preg Test, Ur: POSITIVE — AB

## 2023-01-05 NOTE — MAU Note (Signed)
MAU registration called out due to the pt needing to leave and wanted to know if she could see her results on Mychart. This RN consulted with MD and told registration that MD states that he cannot give medical advice until the results come back. RN told registration that if the pt needs to leave the pt will need to sign a AMA form per MD.

## 2023-01-05 NOTE — ED Provider Triage Note (Signed)
Emergency Medicine Provider OB Triage Evaluation Note  Nancy Orozco is a 27 y.o. female, G2P0101, at Unknown gestation who presents to the emergency department with complaints of vaginal bleeding.  It started yesterday.  Reports last normal period 8 to 9 weeks ago.  She had a pregnancy test about a week ago.  Review of  Systems  Positive: Vaginal bleeding cramping Negative: Syncope  Physical Exam  BP 107/76 (BP Location: Left Arm)   Pulse 80   Temp 98 F (36.7 C) (Oral)   Resp 18   Ht 5\' 5"  (1.651 m)   Wt 54.4 kg   SpO2 100%   BMI 19.97 kg/m  General: Awake, no distress  HEENT: Atraumatic  Resp: Normal effort  Cardiac: Normal rate Abd: Nondistended, nontender  MSK: Moves all extremities without difficulty Neuro: Speech clear  Medical Decision Making  Pt evaluated for pregnancy concern and is stable for transfer to MAU. Pt is in agreement with plan for transfer.  3:32 PM Discussed with MAU MD, who accepts patient in transfer.  Clinical Impression   1. Vaginal bleeding        Renne Crigler, PA-C 01/05/23 1533

## 2023-01-05 NOTE — MAU Provider Note (Cosign Needed)
VB in s/o confirmed preg test    S Ms. Nancy Orozco is a 27 y.o. G33P0101 pregnant female at [redacted]w[redacted]d who presents to MAU today with complaint of vaginal bleeding.    Pertinent items noted in HPI and remainder of comprehensive ROS otherwise negative.   O BP 111/73 (BP Location: Right Arm)   Pulse 78   Temp 97.6 F (36.4 C) (Oral)   Resp 18   Ht 5\' 5"  (1.651 m)   Wt 54.5 kg   LMP 11/05/2022   SpO2 100%   BMI 20.00 kg/m  Physical Exam   MDM: MAU Course: Upreg positive   [place] w/u hCG 10,371 ABO/Rh O pos CBC wnl GC collected Wet Prep neg   [place]: ordered    A&P: #8 weeks   Not see yet but signed out to MAU provider while awaiting patient to be roomed for ?pelvic exam and [place] results.      Hessie Dibble, MD 01/05/2023 7:46 PM

## 2023-01-05 NOTE — MAU Provider Note (Incomplete Revision)
VB in s/o confirmed preg test    S Ms. Nancy Orozco is a 27 y.o. G33P0101 pregnant female at [redacted]w[redacted]d who presents to MAU today with complaint of vaginal bleeding.    Pertinent items noted in HPI and remainder of comprehensive ROS otherwise negative.   O BP 111/73 (BP Location: Right Arm)   Pulse 78   Temp 97.6 F (36.4 C) (Oral)   Resp 18   Ht 5\' 5"  (1.651 m)   Wt 54.5 kg   LMP 11/05/2022   SpO2 100%   BMI 20.00 kg/m  Physical Exam   MDM: MAU Course: Upreg positive   [place] w/u hCG 10,371 ABO/Rh O pos CBC wnl GC collected Wet Prep neg   [place]: ordered    A&P: #8 weeks   Not see yet but signed out to MAU provider while awaiting patient to be roomed for ?pelvic exam and [place] results.      Hessie Dibble, MD 01/05/2023 7:46 PM

## 2023-01-05 NOTE — ED Triage Notes (Signed)
Pt states yesterday, started with bright red bleeding with severe abdominal cramping. Pt states she a positive pregnancy test a month ago. Pt reports today she is having brown spotting with intermittent severe abdominal pain. Pt states she has felt dizzy/lightheaded. Pt reports this is her second pregnancy.

## 2023-01-05 NOTE — MAU Note (Signed)
Nancy Orozco is a 27 y.o. at [redacted]w[redacted]d here in MAU reporting: pt sent from main ED after confirming preg. Had some bleeding last wk on Wed. Yesterday she had bright red bleeding.  Was a little concerned.   Had a very bad pain in lower abd.  Tried to sleep through it.   This morning the blood was brown.  Was feeling a lot of pelvic pressure, made it hard to walk.  Bleeding is light, brown in color, wearing a liner. Pain was a 10-very sharp, no longer feeling the pressure.  The sharp pain is  gone now, just having mild cramping. LMP: 8/9 Onset of complaint: yesterday Pain score: mild cramping Vitals:   01/05/23 1451 01/05/23 1724  BP: 107/76 111/73  Pulse: 80 78  Resp: 18 18  Temp: 98 F (36.7 C) 97.6 F (36.4 C)  SpO2: 100% 100%      Lab orders placed from triage:

## 2023-01-06 LAB — GC/CHLAMYDIA PROBE AMP (~~LOC~~) NOT AT ARMC
Chlamydia: NEGATIVE
Comment: NEGATIVE
Comment: NORMAL
Neisseria Gonorrhea: NEGATIVE
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
## 2023-01-12 ENCOUNTER — Encounter: Payer: Self-pay | Admitting: Obstetrics and Gynecology

## 2023-01-12 ENCOUNTER — Ambulatory Visit (INDEPENDENT_AMBULATORY_CARE_PROVIDER_SITE_OTHER): Payer: Medicaid Other | Admitting: Obstetrics and Gynecology

## 2023-01-12 ENCOUNTER — Other Ambulatory Visit (INDEPENDENT_AMBULATORY_CARE_PROVIDER_SITE_OTHER): Payer: Medicaid Other

## 2023-01-12 VITALS — BP 109/71 | HR 74 | Wt 120.0 lb

## 2023-01-12 DIAGNOSIS — Z3A09 9 weeks gestation of pregnancy: Secondary | ICD-10-CM | POA: Diagnosis not present

## 2023-01-12 DIAGNOSIS — O200 Threatened abortion: Secondary | ICD-10-CM | POA: Insufficient documentation

## 2023-01-12 DIAGNOSIS — Z3A01 Less than 8 weeks gestation of pregnancy: Secondary | ICD-10-CM

## 2023-01-12 LAB — CBC
Hematocrit: 43.5 % (ref 34.0–46.6)
Hemoglobin: 14.7 g/dL (ref 11.1–15.9)
MCH: 30.4 pg (ref 26.6–33.0)
MCHC: 33.8 g/dL (ref 31.5–35.7)
MCV: 90 fL (ref 79–97)
Platelets: 238 10*3/uL (ref 150–450)
RBC: 4.84 x10E6/uL (ref 3.77–5.28)
RDW: 13.6 % (ref 11.7–15.4)
WBC: 5.5 10*3/uL (ref 3.4–10.8)

## 2023-01-12 NOTE — Progress Notes (Signed)
Patient informed that the ultrasound is considered a limited obstetric ultrasound and is not intended to be a complete ultrasound exam.  Patient also informed that the ultrasound is not being completed with the intent of assessing for fetal or placental anomalies or any pelvic abnormalities. Explained that the purpose of today's ultrasound is to assess for fetal heart rate.  Patient acknowledges the purpose of the exam and the limitations of the study.        Pt has had heavy bleeding and cramping over the weekend, still bleeding today

## 2023-01-12 NOTE — Patient Instructions (Signed)
FACTS YOU SHOULD KNOW  WHAT IS AN EARLY PREGNANCY FAILURE? Once the egg is fertilized with the sperm and begins to develop, it attaches to the lining of the uterus. This early pregnancy tissue may not develop into an embryo (the beginning stage of a baby). Sometimes an embryo does develop but does not continue to grow. These problems can be seen on ultrasound.   MANAGEMNT OF EARLY PREGNANCY FAILURE: About 4 out of 100 (0.25%) women will have a pregnancy loss in her lifetime.  One in five pregnancies is found to be an early pregnancy failure.  There are 3 ways to care for an early pregnancy failure:   Surgery, (2) Medicine, (3) Waiting for you to pass the pregnancy on your own. The decision as to how to proceed after being diagnosed with and early pregnancy failure is an individual one.  The decision can be made only after appropriate counseling.  You need to weigh the pros and cons of the 3 choices. Then you can make the choice that works for you. SURGERY (D&E) Procedure over in 1 day Requires being put to sleep Bleeding may be light Possible problems during surgery, including injury to womb(uterus) Care provider has more control Medicine (CYTOTEC) The complete procedure may take days to weeks No Surgery Bleeding may be heavy at times There may be drug side effects Patient has more control Waiting You may choose to wait, in which case your own body may complete the passing of the abnormal early pregnancy on its own in about 2-4 weeks Your bleeding may be heavy at times There is a small possibility that you may need surgery if the bleeding is too much or not all of the pregnancy has passed. CYTOTEC MANAGEMENT Prostaglandins (cytotec) are the most widely used drug for this purpose. They cause the uterus to cramp and contract. You will place the medicine yourself inside your vagina in the privacy of your home. Empting of the uterus should occur within 3 days but the process may continue for  several weeks. The bleeding may seem heavy at times. POSSIBLE SIDE EFFECTS FROM CYTOTEC Nausea   Vomiting Diarrhea Fever Chills  Hot Flashes Side effects  from the process of the early pregnancy failure include: Cramping  Bleeding Headaches  Dizziness RISKS: This is a low risk procedure. Less than 1 in 100 women has a complication. An incomplete passage of the early pregnancy may occur. Also, Hemorrhage (heavy bleeding) could happen.  Rarely the pregnancy will not be passed completely. Excessively heavy bleeding may occur.  Your doctor may need to perform surgery to empty the uterus (D&E). Afterwards: Everybody will feel differently after the early pregnancy completion. You may have soreness or cramps for a day or two. You may have soreness or cramps for day or two.  You may have light bleeding for up to 2 weeks. You may be as active as you feel like being. If you have any of the following problems you may call Maternity Admissions Unit at [phone]. If you have pain that does not get better  with pain medication Bleeding that soaks through 2 thick full-sized sanitary pads in an hour Cramps that last longer than 2 days Foul smelling discharge Fever above 100.4 degrees F Even if you do not have any of these symptoms, you should have a follow-up exam to make sure you are healing properly. This appointment will be made for you before you leave the hospital. Your next normal period will start again in 4-6  week after the loss. You can get pregnant soon after the loss, so use birth control right away. Finally: Make sure all your questions are answered before during and after any procedure. Follow up with medical care and family planning methods.
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
## 2023-01-19 ENCOUNTER — Other Ambulatory Visit: Payer: Self-pay

## 2023-01-27 ENCOUNTER — Other Ambulatory Visit: Payer: Medicaid Other

## 2023-01-31 ENCOUNTER — Encounter: Payer: Self-pay | Admitting: Obstetrics & Gynecology

## 2023-01-31 ENCOUNTER — Ambulatory Visit: Payer: Medicaid Other | Admitting: Obstetrics & Gynecology

## 2023-01-31 ENCOUNTER — Other Ambulatory Visit (INDEPENDENT_AMBULATORY_CARE_PROVIDER_SITE_OTHER): Payer: Medicaid Other

## 2023-01-31 VITALS — BP 118/78 | HR 98

## 2023-01-31 DIAGNOSIS — Z3A01 Less than 8 weeks gestation of pregnancy: Secondary | ICD-10-CM

## 2023-01-31 DIAGNOSIS — O021 Missed abortion: Secondary | ICD-10-CM | POA: Diagnosis not present

## 2023-01-31 DIAGNOSIS — O039 Complete or unspecified spontaneous abortion without complication: Secondary | ICD-10-CM

## 2023-01-31 DIAGNOSIS — Z5189 Encounter for other specified aftercare: Secondary | ICD-10-CM

## 2023-01-31 DIAGNOSIS — O200 Threatened abortion: Secondary | ICD-10-CM

## 2023-01-31 DIAGNOSIS — Z3A12 12 weeks gestation of pregnancy: Secondary | ICD-10-CM

## 2023-01-31 NOTE — Patient Instructions (Signed)
Nancy Orozco is a virtual mental health platform available to our patients   We can refer you to a local mental health provider or you can refer yourself to this online platform using the link below  [email or web address]

## 2023-01-31 NOTE — Progress Notes (Signed)
OFFICE VISIT NOTE  History:   Lynniah Janoski is a 27 y.o. G2P0101 here today for follow up after recent diagnosis of MAB. Had heavy bleeding with clots last week, but unsure everything was out.  Currently having spotting with mild cramping.  She denies any or other concerns.    Past Medical History:  Diagnosis Date   Anemia 2017   Iron Deficiency when pregnant   Anxiety    Arrhythmia    dehydration related   Headache    UTI (urinary tract infection)     Past Surgical History:  Procedure Laterality Date   NO PAST SURGERIES      The following portions of the patient's history were reviewed and updated as appropriate: allergies, current medications, past family history, past medical history, past social history, past surgical history and problem list.   Health Maintenance:  Normal pap on 08/07/2020.   Review of Systems:  Pertinent items noted in HPI and remainder of comprehensive ROS otherwise negative.  Physical Exam:  BP 118/78   Pulse 98   LMP 11/05/2022  CONSTITUTIONAL: Well-developed, well-nourished female in no acute distress.  HEENT:  Normocephalic, atraumatic. External right and left ear normal. No scleral icterus.  NECK: Normal range of motion, supple, no masses noted on observation SKIN: No rash noted. Not diaphoretic. No erythema. No pallor. MUSCULOSKELETAL: Normal range of motion. No edema noted. NEUROLOGIC: Alert and oriented to person, place, and time. Normal muscle tone coordination. No cranial nerve deficit noted. PSYCHIATRIC: Normal mood and affect. Normal behavior. Normal judgment and thought content. CARDIOVASCULAR: Normal heart rate noted RESPIRATORY: Effort and breath sounds normal, no problems with respiration noted ABDOMEN: No masses noted. No other overt distention noted.   PELVIC: Deferred  Labs and Imaging 01/31/2023  Preliminary results of office scan on my review: Thin endometrial stripe, no residual retained products  US OB Limited  Result  Date: 01/12/2023 ----------------------------------------------------------------------  OBSTETRICS REPORT                       (Signed Final 01/12/2023 10:26 am) ---------------------------------------------------------------------- Patient Info  ID #:       [id number]                          D.O.B.:  1995/04/13 (27 yrs)  Name:       Etter Sjogren                    Visit Date: 01/12/2023 10:05 am ---------------------------------------------------------------------- Performed By  Attending:        [place] Bing MD     Ref. Address:     [address]                                                             [address]  Performed By:     Scheryl Marten RN     [facility]  Referred By:      [facility] Mila Merry ---------------------------------------------------------------------- Orders  #  Description                           Code        Ordered By  1  US OB LIMITED                         [id number]     [place] Bing ----------------------------------------------------------------------  #  Order #                     Accession #                Episode #  1  [id number]                   [id number]                 [id number] ---------------------------------------------------------------------- Indications  Missed abortion < 22 weeks                     O02.1  [redacted] weeks gestation of pregnancy                 Z3A.09 ---------------------------------------------------------------------- Fetal Evaluation  Num Of Fetuses:         1  Preg. Location:         Intrauterine  Gest. Sac:              Intrauterine  Fetal Pole:             Visualized  Cardiac Activity:       Not visualized  Comment:    Doppler flow utilized ---------------------------------------------------------------------- Biometry  CRL:        3.4  mm     G. Age:  6w 0d                   EDD:   09/07/23 ---------------------------------------------------------------------- OB History  Gravidity:    2         Term:   0        Prem:   1        SAB:   0  TOP:          0       Ectopic:  0        Living: 1 ---------------------------------------------------------------------- Gestational Age  LMP:           9w 5d         Date:  11/05/22                   EDD:   08/12/23  Best:          9w 5d      Det. By:  LMP  (11/05/22)          EDD:   08/12/23 ---------------------------------------------------------------------- Cervix Uterus Adnexa  Uterus  Retroverted ---------------------------------------------------------------------- Impression  Patient stable ---------------------------------------------------------------------- Recommendations  CRL 3.45mm on 01/05/2023. Given this, early pregnancy failure  diagnosed and discussed with patient and partner. Options  discussed and patient elects for expectant management.  Follow up repeat CBC for today and RTC one week for follow  up. Patient is Rh positive. ED precautions given. ----------------------------------------------------------------------                 [place] Bing, MD Electronically Signed Final Report   01/12/2023 10:26 am ----------------------------------------------------------------------   US OB LESS THAN 14 WEEKS WITH OB TRANSVAGINAL  Result Date: 01/05/2023 CLINICAL DATA:  [id number] Vaginal bleeding during pregnancy [id number]. Gestational age by last menstrual period 8 weeks and 5 days. Estimated due date by last menstrual period 08/12/2023. Last menstrual period 11/05/2022 EXAM: OBSTETRIC <14 WK [place] AND TRANSVAGINAL OB US TECHNIQUE: Both transabdominal and transvaginal ultrasound examinations were performed for complete evaluation of the gestation as well as the maternal uterus, adnexal regions, and pelvic cul-de-sac. Transvaginal technique was performed to assess early pregnancy. COMPARISON:   None Available. FINDINGS: Intrauterine gestational sac: Single Yolk sac:  Visualized. Embryo:  Visualized. Cardiac Activity: Not Visualized. CRL:  3.5 mm   6 w   0 d                  [place] EDC: 08/31/2023 Subchorionic hemorrhage:  None visualized. Maternal uterus/adnexae: Bilateral ovaries are not visualized. IMPRESSION: 1. Single intrauterine pregnancy with no cardiac activity. Finding may be due to early gestational age. Recommend follow-up ultrasound in 7 - 10 days to evaluate for viability (or earlier if clinically indicated). 2. Bilateral ovaries are not visualized. Electronically Signed   By: Tish Frederickson M.D.   On: 01/05/2023 19:54      Assessment and Plan:    1. Follow-up visit after miscarriage Completed miscarriage. Support given to patient, given information about [facility]. She is unsure of future pregnancy plans, she will let us know.  Routine preventative health maintenance measures emphasized. Please refer to After Visit Summary for other counseling recommendations.   Return for any gynecologic concerns.    I spent 25 minutes dedicated to the care of this patient including pre-visit review of records, face to face time with the patient discussing her conditions and treatments and post visit orders.    Jaynie Collins, MD, FACOG Obstetrician & Gynecologist, [facility] for [employer], [facility]

## 2023-01-31 NOTE — Progress Notes (Signed)
Patient informed that the ultrasound is considered a limited obstetric ultrasound and is not intended to be a complete ultrasound exam.  Patient also informed that the ultrasound is not being completed with the intent of assessing for fetal or placental anomalies or any pelvic abnormalities. Explained that the purpose of today's ultrasound is to assess for MAB.  Patient acknowledges the purpose of the exam and the limitations of the study.

## 2023-03-16 ENCOUNTER — Ambulatory Visit: Payer: Medicaid Other | Admitting: Internal Medicine

## 2023-04-17 ENCOUNTER — Ambulatory Visit (HOSPITAL_COMMUNITY): Payer: Medicaid Other

## 2023-11-04 ENCOUNTER — Encounter: Payer: Self-pay | Admitting: Obstetrics & Gynecology

## 2024-03-27 ENCOUNTER — Encounter

## 2024-03-28 ENCOUNTER — Ambulatory Visit: Admitting: *Deleted

## 2024-03-28 ENCOUNTER — Other Ambulatory Visit: Payer: Self-pay

## 2024-03-28 VITALS — BP 112/75 | HR 87 | Wt 122.0 lb

## 2024-03-28 DIAGNOSIS — O3680X0 Pregnancy with inconclusive fetal viability, not applicable or unspecified: Secondary | ICD-10-CM

## 2024-03-28 DIAGNOSIS — Z3A12 12 weeks gestation of pregnancy: Secondary | ICD-10-CM | POA: Diagnosis not present

## 2024-03-28 DIAGNOSIS — O219 Vomiting of pregnancy, unspecified: Secondary | ICD-10-CM | POA: Insufficient documentation

## 2024-03-28 DIAGNOSIS — Z3480 Encounter for supervision of other normal pregnancy, unspecified trimester: Secondary | ICD-10-CM | POA: Insufficient documentation

## 2024-03-28 DIAGNOSIS — Z8639 Personal history of other endocrine, nutritional and metabolic disease: Secondary | ICD-10-CM | POA: Insufficient documentation

## 2024-03-28 DIAGNOSIS — Z3481 Encounter for supervision of other normal pregnancy, first trimester: Secondary | ICD-10-CM

## 2024-03-28 DIAGNOSIS — O09899 Supervision of other high risk pregnancies, unspecified trimester: Secondary | ICD-10-CM | POA: Insufficient documentation
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
## 2024-03-29 LAB — CBC/D/PLT+RPR+RH+ABO+RUBIGG...
Antibody Screen: NEGATIVE
Basophils Absolute: 0.1 x10E3/uL (ref 0.0–0.2)
Basos: 1 %
EOS (ABSOLUTE): 0.1 x10E3/uL (ref 0.0–0.4)
Eos: 1 %
HCV Ab: NONREACTIVE
HIV Screen 4th Generation wRfx: NONREACTIVE
Hematocrit: 40.3 % (ref 34.0–46.6)
Hemoglobin: 13.1 g/dL (ref 11.1–15.9)
Hepatitis B Surface Ag: NEGATIVE
Immature Grans (Abs): 0 x10E3/uL (ref 0.0–0.1)
Immature Granulocytes: 0 %
Lymphocytes Absolute: 1 x10E3/uL (ref 0.7–3.1)
Lymphs: 13 %
MCH: 30.3 pg (ref 26.6–33.0)
MCHC: 32.5 g/dL (ref 31.5–35.7)
MCV: 93 fL (ref 79–97)
Monocytes Absolute: 0.5 x10E3/uL (ref 0.1–0.9)
Monocytes: 6 %
Neutrophils Absolute: 6.1 x10E3/uL (ref 1.4–7.0)
Neutrophils: 79 %
Platelets: 220 x10E3/uL (ref 150–450)
RBC: 4.33 x10E6/uL (ref 3.77–5.28)
RDW: 13.3 % (ref 11.7–15.4)
RPR Ser Ql: NONREACTIVE
Rh Factor: POSITIVE
Rubella Antibodies, IGG: 1.46 {index}
WBC: 7.8 x10E3/uL (ref 3.4–10.8)

## 2024-03-29 LAB — HEMOGLOBIN A1C
Est. average glucose Bld gHb Est-mCnc: 88 mg/dL
Hgb A1c MFr Bld: 4.7 % — ABNORMAL LOW (ref 4.8–5.6)

## 2024-03-29 LAB — VITAMIN D 25 HYDROXY (VIT D DEFICIENCY, FRACTURES): Vit D, 25-Hydroxy: 36.6 ng/mL (ref 30.0–100.0)

## 2024-03-29 LAB — HCV INTERPRETATION

## 2024-03-30 ENCOUNTER — Ambulatory Visit: Payer: Self-pay | Admitting: Obstetrics and Gynecology

## 2024-03-30 DIAGNOSIS — Z3480 Encounter for supervision of other normal pregnancy, unspecified trimester: Secondary | ICD-10-CM

## 2024-03-30 LAB — CULTURE, OB URINE

## 2024-03-30 LAB — URINE CULTURE, OB REFLEX

## 2024-04-04 LAB — PANORAMA PRENATAL TEST FULL PANEL:PANORAMA TEST PLUS 5 ADDITIONAL MICRODELETIONS: FETAL FRACTION: 9.8

## 2024-04-05 LAB — HORIZON CUSTOM: REPORT SUMMARY: NEGATIVE

## 2024-04-10 ENCOUNTER — Encounter: Admitting: Obstetrics & Gynecology

## 2024-05-02 ENCOUNTER — Ambulatory Visit: Admitting: Certified Nurse Midwife

## 2024-05-02 ENCOUNTER — Other Ambulatory Visit (HOSPITAL_COMMUNITY): Admission: RE | Admit: 2024-05-02 | Source: Ambulatory Visit

## 2024-05-02 VITALS — BP 106/70 | HR 88 | Wt 124.0 lb

## 2024-05-02 DIAGNOSIS — N949 Unspecified condition associated with female genital organs and menstrual cycle: Secondary | ICD-10-CM

## 2024-05-02 DIAGNOSIS — Z3492 Encounter for supervision of normal pregnancy, unspecified, second trimester: Secondary | ICD-10-CM

## 2024-05-02 DIAGNOSIS — Z3A17 17 weeks gestation of pregnancy: Secondary | ICD-10-CM

## 2024-05-02 DIAGNOSIS — R110 Nausea: Secondary | ICD-10-CM

## 2024-05-02 DIAGNOSIS — R102 Pelvic and perineal pain unspecified side: Secondary | ICD-10-CM

## 2024-05-02 DIAGNOSIS — M5431 Sciatica, right side: Secondary | ICD-10-CM

## 2024-05-02 DIAGNOSIS — O09899 Supervision of other high risk pregnancies, unspecified trimester: Secondary | ICD-10-CM

## 2024-05-02 DIAGNOSIS — L299 Pruritus, unspecified: Secondary | ICD-10-CM

## 2024-05-02 NOTE — Patient Instructions (Signed)

## 2024-05-02 NOTE — Progress Notes (Unsigned)
 Pt complains of continued itching, nausea, dizziness and pelvic pressure.

## 2024-05-04 LAB — CERVICOVAGINAL ANCILLARY ONLY
Chlamydia: NEGATIVE
Comment: NEGATIVE
Comment: NEGATIVE
Comment: NORMAL
Neisseria Gonorrhea: NEGATIVE
Trichomonas: NEGATIVE
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
## 2024-05-10 ENCOUNTER — Encounter: Admitting: Obstetrics & Gynecology

## 2024-05-28 ENCOUNTER — Encounter: Admitting: Obstetrics and Gynecology

## 2024-06-01 ENCOUNTER — Other Ambulatory Visit

## 2024-06-01 ENCOUNTER — Ambulatory Visit
